# Patient Record
Sex: Female | Born: 1985
Health system: Southern US, Community
[De-identification: ages and names within clinical notes are randomized; demographics above are authoritative.]

## PROBLEM LIST (undated history)

## (undated) ENCOUNTER — Inpatient Hospital Stay (HOSPITAL_COMMUNITY): Payer: Self-pay

## (undated) DIAGNOSIS — R87629 Unspecified abnormal cytological findings in specimens from vagina: Secondary | ICD-10-CM

## (undated) DIAGNOSIS — I10 Essential (primary) hypertension: Secondary | ICD-10-CM

## (undated) DIAGNOSIS — Z3490 Encounter for supervision of normal pregnancy, unspecified, unspecified trimester: Secondary | ICD-10-CM

## (undated) DIAGNOSIS — R51 Headache: Secondary | ICD-10-CM

## (undated) DIAGNOSIS — E119 Type 2 diabetes mellitus without complications: Secondary | ICD-10-CM

## (undated) DIAGNOSIS — R569 Unspecified convulsions: Secondary | ICD-10-CM

## (undated) HISTORY — DX: Headache: R51

## (undated) HISTORY — DX: Encounter for supervision of normal pregnancy, unspecified, unspecified trimester: Z34.90

## (undated) HISTORY — PX: LEEP: SHX91

## (undated) HISTORY — DX: Essential (primary) hypertension: I10

## (undated) HISTORY — PX: NO PAST SURGERIES: SHX2092

## (undated) HISTORY — DX: Type 2 diabetes mellitus without complications: E11.9

## (undated) HISTORY — DX: Unspecified abnormal cytological findings in specimens from vagina: R87.629

## (undated) HISTORY — DX: Unspecified convulsions: R56.9

---
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
## 2006-03-22 ENCOUNTER — Emergency Department (HOSPITAL_COMMUNITY): Admission: EM | Admit: 2006-03-22 | Discharge: 2006-03-22 | Payer: Self-pay | Admitting: Emergency Medicine

## 2006-04-19 ENCOUNTER — Emergency Department (HOSPITAL_COMMUNITY): Admission: EM | Admit: 2006-04-19 | Discharge: 2006-04-19 | Payer: Self-pay | Admitting: Family Medicine

## 2006-10-14 ENCOUNTER — Emergency Department (HOSPITAL_COMMUNITY): Admission: EM | Admit: 2006-10-14 | Discharge: 2006-10-14 | Payer: Self-pay | Admitting: Emergency Medicine

## 2006-10-19 ENCOUNTER — Emergency Department (HOSPITAL_COMMUNITY): Admission: EM | Admit: 2006-10-19 | Discharge: 2006-10-19 | Payer: Self-pay | Admitting: Family Medicine

## 2007-01-17 ENCOUNTER — Ambulatory Visit (HOSPITAL_COMMUNITY): Admission: RE | Admit: 2007-01-17 | Discharge: 2007-01-17 | Payer: Self-pay | Admitting: Family Medicine

## 2007-01-17 ENCOUNTER — Emergency Department (HOSPITAL_COMMUNITY): Admission: EM | Admit: 2007-01-17 | Discharge: 2007-01-17 | Payer: Self-pay | Admitting: Emergency Medicine

## 2007-01-17 ENCOUNTER — Emergency Department (HOSPITAL_COMMUNITY): Admission: EM | Admit: 2007-01-17 | Discharge: 2007-01-17 | Payer: Self-pay | Admitting: Family Medicine

## 2009-08-27 ENCOUNTER — Emergency Department (HOSPITAL_COMMUNITY): Admission: EM | Admit: 2009-08-27 | Discharge: 2009-08-27 | Payer: Self-pay | Admitting: Emergency Medicine

## 2010-02-28 ENCOUNTER — Ambulatory Visit: Payer: Self-pay | Admitting: Family Medicine

## 2010-02-28 DIAGNOSIS — R569 Unspecified convulsions: Secondary | ICD-10-CM | POA: Insufficient documentation

## 2010-02-28 DIAGNOSIS — I10 Essential (primary) hypertension: Secondary | ICD-10-CM

## 2010-02-28 DIAGNOSIS — R51 Headache: Secondary | ICD-10-CM

## 2010-02-28 DIAGNOSIS — R519 Headache, unspecified: Secondary | ICD-10-CM | POA: Insufficient documentation

## 2010-02-28 HISTORY — DX: Essential (primary) hypertension: I10

## 2010-02-28 HISTORY — DX: Headache: R51
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
## 2010-05-30 ENCOUNTER — Ambulatory Visit: Payer: Self-pay | Admitting: Family Medicine

## 2010-06-03 ENCOUNTER — Telehealth: Payer: Self-pay | Admitting: Family Medicine

## 2010-06-12 ENCOUNTER — Telehealth: Payer: Self-pay | Admitting: Internal Medicine

## 2010-06-12 ENCOUNTER — Emergency Department (HOSPITAL_COMMUNITY): Admission: EM | Admit: 2010-06-12 | Discharge: 2010-06-12 | Payer: Self-pay | Admitting: Emergency Medicine

## 2010-06-13 ENCOUNTER — Ambulatory Visit: Payer: Self-pay | Admitting: Internal Medicine

## 2010-06-13 DIAGNOSIS — M7989 Other specified soft tissue disorders: Secondary | ICD-10-CM

## 2010-06-13 DIAGNOSIS — T50995A Adverse effect of other drugs, medicaments and biological substances, initial encounter: Secondary | ICD-10-CM | POA: Insufficient documentation

## 2010-06-27 ENCOUNTER — Ambulatory Visit: Payer: Self-pay | Admitting: Family Medicine

## 2010-06-27 LAB — CONVERTED CEMR LAB
Calcium: 9.1 mg/dL (ref 8.4–10.5)
Chloride: 106 meq/L (ref 96–112)
Creatinine, Ser: 0.8 mg/dL (ref 0.4–1.2)
GFR calc non Af Amer: 119.44 mL/min (ref >60)
Sodium: 136 meq/L (ref 135–145)

## 2010-12-30 NOTE — Assessment & Plan Note (Signed)
Summary: concerned about BP/dm   Vital Signs:  Patient profile:   25 year old female Menstrual status:  regular Temp:     98.0 degrees F oral BP sitting:   120 / 88  (left arm) Cuff size:   regular  Vitals Entered By: Sid Falcon LPN (June 27, 2010 11:19 AM) CC: BP checl   History of Present Illness: Followup hypertension. Patient had some headaches and edema issues on amlodipine. She felt fatigued on Hydrocort thiazide. Those medicaions were discontinued and patient started on Prinivil 10 mg daily last visit. Her blood pressures have been higher with diastolics in the 90s and most systolics 120-130 range.  Patient is exercising about 2-3 days per week. Cautious with sodium intake. Denies any dizziness, cough, or other side effect.  Allergies: 1)  Bactrim (Sulfamethoxazole-Trimethoprim)  Past History:  Past Medical History: Last updated: 06/13/2010 Headache/Migraines Hypertension  ? onset 2011 Seizure disorder/Epilepsy  age 31 years  grand mal no seizure since 2004 PMH reviewed for relevance  Review of Systems  The patient denies chest pain, syncope, dyspnea on exertion, peripheral edema, prolonged cough, and headaches.    Physical Exam  General:  Well-developed,well-nourished,in no acute distress; alert,appropriate and cooperative throughout examination Eyes:  No corneal or conjunctival inflammation noted. EOMI. Perrla. Funduscopic exam benign, without hemorrhages, exudates or papilledema. Vision grossly normal. Neck:  No deformities, masses, or tenderness noted. Lungs:  Normal respiratory effort, chest expands symmetrically. Lungs are clear to auscultation, no crackles or wheezes. Heart:  Normal rate and regular rhythm. S1 and S2 normal without gallop, murmur, click, rub or other extra sounds. Extremities:  no edema   Impression & Recommendations:  Problem # 1:  HYPERTENSION (ICD-401.9) Assessment Improved  side effects have resolved off amlodipine. Increase  lisinopril to 20 mg daily The following medications were removed from the medication list:    Amlodipine Besylate 5 Mg Tabs (Amlodipine besylate) ..... Once daily    Hydrochlorothiazide 25 Mg Tabs (Hydrochlorothiazide) ..... One half tablet a day as needed Her updated medication list for this problem includes:    Prinivil 20 Mg Tabs (Lisinopril) ..... One by mouth once daily  Orders: TLB-BMP (Basic Metabolic Panel-BMET) (80048-METABOL) Specimen Handling ([address]) Venipuncture (60[address])  Complete Medication List: 1)  Topamax 100 Mg Tabs (Topiramate) .... Once daily 2)  Prinivil 20 Mg Tabs (Lisinopril) .... One by mouth once daily  Patient Instructions: 1)  Please schedule a follow-up appointment in 6 months .  2)  Limit your Sodium(salt) .  3)  It is important that you exercise reguarly at least 20 minutes 5 times a week. If you develop chest pain, have severe difficulty breathing, or feel very tired, stop exercising immediately and seek medical attention.  4)  You need to lose weight. Consider a lower calorie diet and regular exercise.  5)  Check your  Blood Pressure regularly . If it is above:  140/90 you should make an appointment. Prescriptions: PRINIVIL 20 MG TABS (LISINOPRIL) one by mouth once daily  #90 x 3   Entered and Authorized by:   Evelena Peat MD   Signed by:   Evelena Peat MD on 06/27/2010   Method used:   [facility] to        [facility]  [phone]* (retail)       [address] [place], [place]  [address]       Ph: [phone] or [phone]       Fax: [phone]  RxID:   [id number]

## 2010-12-30 NOTE — Assessment & Plan Note (Signed)
Summary: BRAND NEW PT/TO EST/CJR   Vital Signs:  Patient profile:   25 year old female Menstrual status:  regular LMP:     02/20/2010 Height:      64.75 inches Weight:      175 pounds BMI:     29.45 Temp:     98.7 degrees F oral Pulse rate:   100 / minute Pulse rhythm:   regular Resp:     12 per minute BP sitting:   130 / 90  (left arm) Cuff size:   regular  Vitals Entered By: Sid Falcon LPN (February 28, 1609 11:29 AM)  Nutrition Counseling: Patient's BMI is greater than 25 and therefore counseled on weight management options.  Serial Vital Signs/Assessments:  Time      Position  BP       Pulse  Resp  Temp     By                     118/68                         Evelena Peat MD  CC: New to establish, BP concerns, Hypertension Management, Headache Is Patient Diabetic? No LMP (date): 02/20/2010     Menstrual Status regular Enter LMP: 02/20/2010 Last PAP Result normal   History of Present Illness: Patient is new to establish care.  Recently diagnosed hypertension. Went to urgent care with several nonspecific symptoms including diaphoresis and headache. Blood pressure 150 systolic. Started amlodipine 5 mg daily and tolerating well. Blood pressure 130s systolic when checked at drug store. No headaches. Minimal edema.  she reports that she had several labs including thyroid functions at urgent care recently all normal.  Other chronic problems include history of migraine headaches but these are stable. History of grand mal seizures since age 29 but none in 6 years now. Sees neurologist and treated with Topamax 100 mg daily.  Family history significant for multiple individuals with hypertension and type 2 diabetes in both parents. Mother had stroke. Sister with hypertension.  Patient working on masters degree in counseling. Nonsmoker. No alcohol use.  Hypertension History:      She denies headache, chest pain, palpitations, dyspnea with exertion, neurologic problems,  syncope, and side effects from treatment.        Positive major cardiovascular risk factors include hypertension.  Negative major cardiovascular risk factors include female age less than 43 years old and non-tobacco-user status.      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 Alcohol use-no Regular exercise-no  Risk Factors: Exercise: no (02/28/2010)  Risk Factors: Smoking Status: never (02/28/2010)  Past Medical History: Headache/Migraines Hypertension Seizure disorder/Epilepsy  Past Surgical History: no surgical history  Family History: Family History of Alcoholism/Addiction, mental illness, brother Mother, stroke, hypertension, emotional illness, diabetes Father, hypertension, emotional illness, diabetes Maternal grandfather, sudden death < 63 Maternal grandfather, diabetes  Social History: 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  The patient denies anorexia, fever, weight loss, hoarseness, chest pain, syncope, dyspnea on exertion, prolonged cough, headaches, hemoptysis, abdominal pain, melena, hematochezia, severe indigestion/heartburn, hematuria, muscle weakness, and depression.    Physical Exam  General:   Well-developed,well-nourished,in no acute distress; alert,appropriate and cooperative throughout examination Head:  Normocephalic and atraumatic without obvious abnormalities. No apparent alopecia or balding. Eyes:  pupils equal, pupils round, and pupils reactive to light.   Ears:  External ear exam shows no significant lesions or deformities.  Otoscopic examination reveals clear canals, tympanic membranes are intact bilaterally without bulging, retraction, inflammation or discharge. Hearing is grossly normal bilaterally. Mouth:  Oral mucosa and oropharynx without lesions or exudates.  Teeth in good repair. Neck:  No deformities, masses, or tenderness noted. Lungs:  Normal respiratory effort, chest expands symmetrically. Lungs are clear to auscultation, no crackles or wheezes. Heart:  Normal rate and regular rhythm. S1 and S2 normal without gallop, murmur, click, rub or other extra sounds. Extremities:  no edema. Neurologic:  alert & oriented X3, cranial nerves II-XII intact, strength normal in all extremities, and gait normal.   Cervical Nodes:  No lymphadenopathy noted Psych:  normally interactive, good eye contact, not anxious appearing, and not depressed appearing.     Impression & Recommendations:  Problem # 1:  HYPERTENSION (ICD-401.9) Assessment Improved  Her updated medication list for this problem includes:    Amlodipine Besylate 5 Mg Tabs (Amlodipine besylate) ..... Once daily  Problem # 2:  SEIZURE DISORDER (ICD-780.39) Assessment: Unchanged  Her updated medication list for this problem includes:    Topamax 100 Mg Tabs (Topiramate) ..... Once daily  Problem # 3:  HEADACHE (ICD-784.0) Hx of migraines but these are infrequent on Topamax.  Complete Medication List: 1)  Topamax 100 Mg Tabs (Topiramate) .... Once daily 2)  Amlodipine Besylate 5 Mg Tabs (Amlodipine besylate) .... Once daily  Hypertension Assessment/Plan:      The patient's hypertensive risk group is category  A: No risk factors and no target organ damage.  Today's blood pressure is 130/90.    Patient Instructions: 1)  Please schedule a follow-up appointment in 3 months .  2)  Limit your Sodium(salt) .  3)  It is important that you exercise reguarly at least 20 minutes 5 times a week. If you develop chest pain, have severe difficulty breathing, or feel very tired, stop exercising immediately and seek medical attention.  4)  You need to lose weight. Consider a lower calorie diet and regular exercise.  Prescriptions: AMLODIPINE BESYLATE 5 MG TABS (AMLODIPINE BESYLATE) once daily  #30 x 5   Entered and Authorized by:   Evelena Peat MD   Signed by:   Evelena Peat MD on 02/28/2010   Method used:   [facility]        [facility]  [address]  [phone]* (retail)       [address] [place], [place]  [address]       Ph: [phone] or [phone]       Fax: [phone]   RxID:   [id number]   Preventive Care Screening  Pap Smear:    Date:  12/31/2009    Results:  normal   Last Tetanus Booster:    Date:  11/30/1997    Results:  Historical      Mammogram 2007, normal

## 2010-12-30 NOTE — Assessment & Plan Note (Signed)
Summary: 3 month rov/njhr   Vital Signs:  Patient profile:   25 year old female Menstrual status:  regular Weight:      180 pounds Temp:     98 degrees F oral BP sitting:   132 / 92  (left arm) Cuff size:   regular  Vitals Entered By: Sid Falcon LPN (May 31, 7123 10:16 AM) CC: 3 mo nth follow-up, Hypertension Management   History of Present Illness: here for BP follow up. BPs at home mostly 130s systolic with occ of 140s and diastolics usually 80s to low 90s. On amlodipine and compliant with therapy.  Hypertension History:      She complains of peripheral edema, but denies chest pain, palpitations, dyspnea with exertion, orthopnea, PND, visual symptoms, neurologic problems, syncope, and side effects from treatment.        Positive major cardiovascular risk factors include hypertension.  Negative major cardiovascular risk factors include female age less than 34 years old and non-tobacco-user status.     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 Alcohol use-no Regular exercise-no  Past Medical History: Headache/Migraines Hypertension  Seizure disorder/Epilepsy  Family History: Family History of Alcoholism/Addiction, mental illness, brother Mother, stroke, hypertension, emotional illness, diabetes Father, hypertension, emotional illness, diabetes Maternal grandfather, sudden death < 42 Maternal grandfather, diabetes Sister hypertension  Review of Systems      See HPI  Physical Exam  General:  Well-developed,well-nourished,in no acute distress; alert,appropriate and cooperative throughout examination Neck:  No deformities, masses, or tenderness noted. Lungs:  Normal respiratory effort, chest expands symmetrically. Lungs are clear to auscultation, no crackles or wheezes. Heart:  Normal rate and regular rhythm. S1 and S2 normal without gallop,  murmur, click, rub or other extra sounds. Extremities:  trace nonpitting edema bilaterally.   Impression & Recommendations:  Problem # 1:  HYPERTENSION (ICD-401.9) for the most part controlled.  Add low dose HCTZ as needed for edema and cont weight loss efforts. Her updated medication list for this problem includes:    Amlodipine Besylate 5 Mg Tabs (Amlodipine besylate) ..... Once daily    Hydrochlorothiazide 25 Mg Tabs (Hydrochlorothiazide) ..... One half tablet a day as needed  Complete Medication List: 1)  Topamax 100 Mg Tabs (Topiramate) .... Once daily 2)  Amlodipine Besylate 5 Mg Tabs (Amlodipine besylate) .... Once daily 3)  Hydrochlorothiazide 25 Mg Tabs (Hydrochlorothiazide) .... One half tablet a day as needed  Hypertension Assessment/Plan:      The patient's hypertensive risk group is category A: No risk factors and no target organ damage.  Today's blood pressure is 132/92.    Patient Instructions: 1)  Check your  Blood Pressure regularly . If it is above: 130/80  you should make an appointment. 2)  Please schedule a follow-up appointment in 6 months .  3)  It is important that you exercise reguarly at least 20 minutes 5 times a week. If you develop chest pain, have severe difficulty breathing, or feel very tired, stop exercising immediately and seek medical attention.  Prescriptions: HYDROCHLOROTHIAZIDE 25 MG TABS (HYDROCHLOROTHIAZIDE) one half tablet a day as needed  #30 x 3   Entered and Authorized by:   Evelena Peat MD   Signed by:   Evelena Peat MD on 05/30/2010   Method used:   [facility]ron[facility] to        [facility]  [address]  [phone]* (retail)       [address] [place], [place]  [address]       Ph: [phone] or [phone]       Fax: [phone]   RxID:   [id number]

## 2010-12-30 NOTE — Progress Notes (Signed)
Summary: BP concerns  Phone Note Call from Patient   Caller: Patient Call For: Evelena Peat MD Summary of Call: Pt called earlier and requested appt with Dr. Caryl Never when he returns for elevated BP in her GYN office today.  Scheduled Monday and to call back if needs to see one of our other MDs. BP was 150/90, she thinks.  Sister called back and is stating she went to the ER and needs to be seen earlier.  Sheduled appt tomorrow with Dr. Fabian Sharp. [phone] Initial call taken by: Lynann Beaver CMA,  June 12, 2010 5:05 PM    need copy of  records  of ed evlauations before this work in visit.  Madelin Headings MD  June 12, 2010 6:15 PM

## 2010-12-30 NOTE — Progress Notes (Signed)
Summary: leg cramps  Phone Note Call from Patient   Caller: Patient Call For: Evelena Peat MD Summary of Call: Pt has been having leg cramps since beginning the HCTZ, and is asking advice. [phone]  [facility] Initial call taken by: Lynann Beaver CMA,  June 03, 2010 10:24 AM  Follow-up for Phone Call        Make sure she is consuming plenty of K rich foods.  If symptoms persist into next week would go ahead with assessing BMP at that time. Follow-up by: Evelena Peat MD,  June 03, 2010 1:09 PM  Additional Follow-up for Phone Call Additional follow up Details #1::        Pt given Dr. Lucie Leather recommendations. Additional Follow-up by: Lynann Beaver CMA,  June 03, 2010 1:50 PM

## 2010-12-30 NOTE — Assessment & Plan Note (Signed)
Summary: seen in ER with elevated BP and ER spoke to Dr. Todd/dm   Vital Signs:  Patient profile:   25 year old female Menstrual status:  regular LMP:     05/19/2010 Weight:      179 pounds Pulse rate:   60 / minute BP sitting:   120 / 80  (left arm) Cuff size:   regular  Vitals Entered By: Romualdo Bolk, CMA (AAMA) (June 13, 2010 1:54 PM)  Serial Vital Signs/Assessments:  Time      Position  BP       Pulse  Resp  Temp     By                     981/19                         Madelin Headings MD                     124/88                         Madelin Headings MD  Comments: large cuff sitting right By: Madelin Headings MD  reg cuff sitting By: Madelin Headings MD   CC: Pt states that her bp running 140/98 x 1 to 1 1/2 weeks with swelling in feet and legs. Pt was seen in the ED on 7/14. , Hypertension Management, Swelling has been 2-3 times the normal size. Pt states that the HCTZ makes her dizzy even when eating a banana and purnes.  LMP (date): 05/19/2010     Enter LMP: 05/19/2010 Last PAP Result normal   History of Present Illness: Anna Rhodes comes in today  for SDA appt  because of concerns from patient.  She was seen in ed   yesterday because of "swelling ' in LE  and gyne said   Bp was "extremely high "140/90"  . She had  had nl renal function and ua   and  descrived trace edeam and Dr Tawanna Cooler  rec she be seen next week by Dr Caryl Never. S he had been on norvasc didnt take hctx cause of   ?  dizziness. However she called yesterday and wanted to be seen today. because doesnt want to take anything that makes her legs swell.  Also worried about med interactions. Sees  neuro about every  3 months.  [facility] and no siezure in 7 years.  Apparently first dx of Ht was when had syncope and went to ed and was found to have Bp in the 190 range?  no records for this to review today.    Hypertension History:      She complains of headache, dyspnea with exertion, peripheral  edema, and side effects from treatment, but denies chest pain, palpitations, orthopnea, PND, visual symptoms, neurologic problems, and syncope.  She notes the following problems with antihypertensive medication side effects: Swelling due to Norvasc.        Positive major cardiovascular risk factors include hypertension.  Negative major cardiovascular risk factors include female age less than 31 years old and non-tobacco-user status.     Preventive Screening-Counseling & Management  Alcohol-Tobacco     Alcohol drinks/day: 0     Smoking Status: never  Caffeine-Diet-Exercise     Caffeine use/day: 0     Does Patient Exercise: no  Current Medications (verified): 1)  Topamax 100 Mg Tabs (Topiramate) .... Once Daily 2)  Amlodipine Besylate 5 Mg Tabs (Amlodipine Besylate) .... Once Daily 3)  Hydrochlorothiazide 25 Mg Tabs (Hydrochlorothiazide) .... One Half Tablet A Day As Needed  Allergies (verified): 1)  Bactrim (Sulfamethoxazole-Trimethoprim)  Past History:  Past medical, surgical, family and social histories (including risk factors) reviewed, and no changes noted (except as noted below).  Past Medical History: Headache/Migraines Hypertension  ? onset 2011 Seizure disorder/Epilepsy  age 77 years  grand mal no seizure since 2004  Past Surgical History: Reviewed history from 02/28/2010 and no changes required. no surgical history  Past History:  Care Management: Neurology: [facility]   Family History: Reviewed history from 05/30/2010 and no changes required. Family History of Alcoholism/Addiction, mental illness, brother Mother, stroke, hypertension, emotional illness, diabetes Father, hypertension, emotional illness, diabetes Maternal grandfather, sudden death < 66 Maternal grandfather, diabetes Sister hypertension  Social History: Reviewed history from 02/28/2010 and no changes required. 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  no pets   Caffeine use/day:  0  Review of Systems  The patient denies anorexia, fever, weight loss, weight gain, vision loss, decreased hearing, chest pain, syncope, dyspnea on exertion, prolonged cough, abdominal pain, transient blindness, difficulty walking, abnormal bleeding, enlarged lymph nodes, and angioedema.    Physical Exam  General:  alert, well-developed, well-nourished, and well-hydrated.   Head:  normocephalic and atraumatic.   Eyes:  vision grossly intact, pupils equal, and pupils round.   Neck:  No deformities, masses, or tenderness noted. Lungs:  Normal respiratory effort, chest expands symmetrically. Lungs are clear to auscultation, no crackles or wheezes.no dullness.   Heart:  Normal rate and regular rhythm. S1 and S2 normal without gallop, murmur, click, rub or other extra sounds.no lifts.   see bp readings Abdomen:  Bowel sounds positive,abdomen soft and non-tender without masses, organomegaly or   noted. pierced umbi Msk:  no acute changes  Pulses:  pulses intact without delay   Extremities:  trace left pedal edema and trace right pedal edema. no redness no cc    Neurologic:  alert & oriented X3 and gait normal.   slightly off affect  ? understanding of worries  Skin:  turgor normal, color normal, no ecchymoses, and no petechiae.   Cervical Nodes:  No lymphadenopathy noted Psych:  Oriented X3, not anxious appearing, and not depressed appearing.   calm slightly flat affect   obtained and reviewed   ED records    Impression & Recommendations:  Problem # 1:  HYPERTENSION (ICD-401.9) ok to day except borderline diastolic     except by hx   no extreme emergent BP readings. options dicussed and this is a management issue  and should follow up with Dr Caryl Never as PCP.   will start an ace  although may not be as potent  and  have her .fu  in a week   is not planning pregnancy and  told  ACEI should not be taken in preg  Her updated medication list for this problem includes:    Amlodipine Besylate 5 Mg Tabs (Amlodipine besylate) ..... Once daily    Hydrochlorothiazide 25 Mg Tabs (Hydrochlorothiazide) ..... One half tablet a day as needed    Prinivil 20 Mg Tabs (Lisinopril) .Marland KitchenMarland KitchenMarland KitchenMarland Kitchen  1/2 by mouth once daily  and can increase to 1 by mouth once daily  if blood pressure not controlled  Problem # 2:  SWELLING OF LIMB (ICD-729.81) looks minimal but problematic and  discomfort in her legs and she thinks probably correctly from the amlodipine.  doesnt wasnt to take this med or anything that could cause swelling.  Problem # 3:  ADVERSE REACTION TO MEDICATION (CZY-606.30) minor  seemingly  but unacceptable to her   Problem # 4:  SEIZURE DISORDER (ICD-780.39) stable  Her updated medication list for this problem includes:    Topamax 100 Mg Tabs (Topiramate) ..... Once daily  Complete Medication List: 1)  Topamax 100 Mg Tabs (Topiramate) .... Once daily 2)  Amlodipine Besylate 5 Mg Tabs (Amlodipine besylate) .... Once daily 3)  Hydrochlorothiazide 25 Mg Tabs (Hydrochlorothiazide) .... One half tablet a day as needed 4)  Prinivil 20 Mg Tabs (Lisinopril) .... 1/2 by mouth once daily  and can increase to 1 by mouth once daily  if blood pressure not controlled  Hypertension Assessment/Plan:      The patient's hypertensive risk group is category A: No risk factors and no target organ damage.  Today's blood pressure is 120/80.    Patient Instructions: 1)  can stop the norvasc    because of the swelling. 2)  after a few day begin the new BP medication ace inhibitor . 3)  1 by mouth once daily . 4)  Rov with  Dr Caryl Never in a week ( next friday)  5)  Bring in your blood pressure machine in to the appt so this can be compared to the Office reading.  Prescriptions: PRINIVIL 20 MG TABS (LISINOPRIL) 1/2 by mouth once daily  and can  increase to 1 by mouth once daily  if blood pressure not controlled  #30 x 1   Entered and Authorized by:   Madelin Headings MD   Signed by:   Madelin Headings MD on 06/13/2010   Method used:   Electronically to        [facility]  [phone]* (retail)       [address] [place], [place]  [address]       Ph: [phone] or [phone]       Fax: [phone]   RxID:   [id number]

## 2011-01-27 ENCOUNTER — Ambulatory Visit (INDEPENDENT_AMBULATORY_CARE_PROVIDER_SITE_OTHER): Payer: 59 | Admitting: Family Medicine

## 2011-01-27 ENCOUNTER — Encounter: Payer: Self-pay | Admitting: Family Medicine

## 2011-01-27 VITALS — BP 120/80 | Temp 98.5°F | Ht 65.0 in | Wt 189.0 lb

## 2011-01-27 DIAGNOSIS — I10 Essential (primary) hypertension: Secondary | ICD-10-CM

## 2011-01-27 DIAGNOSIS — R569 Unspecified convulsions: Secondary | ICD-10-CM

## 2011-01-27 MED ORDER — LISINOPRIL 20 MG PO TABS: 20.0000 mg | ORAL_TABLET | Freq: Every day | ORAL | Status: DC

## 2011-01-27 NOTE — Progress Notes (Signed)
  Subjective:    Patient ID: Anna Rhodes, female    DOB: 09/05/1986, 25 y.o.   MRN: [id number]  HPI  Patient seen in followup hypertension. Prinivil 20 mg daily. Denies any cough or other side effects. No orthostasis. Blood pressure well controlled by home readings with 120 systolic and around 70-80 diastolic. Exercising fairly consistently. Has lost a little weight since last visit.  Past history menstrual cramps and heavy clots. Workup her gynecologist with ultrasound pending. Mother has history of fibroids.   Remote history of seizure disorder. No seizures 8 years. 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 No other cardiovascular or neurologic concerns.   Review of Systems  Constitutional: Negative for fatigue.  Eyes: Negative for visual disturbance.  Respiratory: Negative for cough, chest tightness, shortness of breath and wheezing.   Cardiovascular: Negative for chest pain, palpitations and leg swelling.  Neurological: Negative for dizziness, seizures, syncope, weakness, light-headedness and headaches.       Objective:   Physical Exam     Patient is alert and in no distress. Pupils equal reactive to light Neck supple no mass Chest clear to auscultation Heart regular rhythm and rate no murmur Extremities no edema Neurologic exam cranial nerves II through XII are normal. Strength of focal deficits. Gait normal.    Assessment & Plan:   #1 hypertension adequate control refill medicine for one year. Work on weight loss aerobic exercise #2 seizure disorder stable for many years. Department of motor vehicle forms completed

## 2011-01-27 NOTE — Patient Instructions (Signed)
Hypertension (High Blood Pressure)   As your heart beats, it forces blood through your arteries. This force is your blood pressure. If the pressure is too high, it is called hypertension (HTN) or high blood pressure. HTN is dangerous because you may have it and not know it. High blood pressure may mean that your heart has to work harder to pump blood. Your arteries may be narrow or stiff. The extra work puts you at risk for heart disease, stroke, and other problems.   Blood pressure consists of two numbers, a higher number over a lower, 110/72, for example. It is stated as “110 over 72.” The ideal is below 120 for the top number (systolic) and under 80 for the bottom (diastolic).   You should pay close attention to your blood pressure if you have certain conditions such as:   Ø Heart failure.   Ø Prior heart attack.   Ø Diabetes Ø Chronic kidney disease.   Ø Prior stroke.   Ø Multiple risk factors for heart disease.       To see if you have HTN, your blood pressure should be measured while you are seated with your arm held at the level of the heart. It should be measured at least twice. A one-time elevated blood pressure reading (especially in the Emergency Department) does not mean that you need treatment. There may be conditions in which the blood pressure is different between your right and left arms. It is important to see your caregiver soon for a recheck.   Most people have essential hypertension which means that there is not a specific cause. This type of high blood pressure may be lowered by changing lifestyle factors such as:   Ø Stress.   Ø Smoking.   Ø Lack of exercise. Ø Excessive weight.   Ø Drug/tobacco/alcohol use.   Ø Eating less salt.    Most people do not have symptoms from high blood pressure until it has caused damage to the body. Effective treatment can often prevent, delay or reduce that damage.   TREATMENT:   Treatment for high blood pressure, when a cause has been identified, is directed at  the cause. There are a large number of medications to treat HTN. These fall into several categories, and your caregiver will help you select the medicines that are best for you. Medications may have side effects. You should review side effects with your caregiver.   If your blood pressure stays high after you have made lifestyle changes or started on medicines,   Ø Your medication(s) may need to be changed.   Ø Other problems may need to be addressed.   Ø Be certain you understand your prescriptions, and know how and when to take your medicine.   Ø Be sure to follow up with your caregiver within the time frame advised (usually within two weeks) to have your blood pressure rechecked and to review your medications.   Ø If you are taking more than one medicine to lower your blood pressure, make sure you know how and at what times they should be taken. Taking two medicines at the same time can result in blood pressure that is too low.   SEEK IMMEDIATE MEDICAL CARE IF YOU DEVELOP:   Ø A severe headache, blurred or changing vision, or confusion.   Ø Unusual weakness or numbness, or a faint feeling.   Ø Severe chest or abdominal pain, vomiting, or breathing problems.   It is your responsibility to follow   up with your caregiver in order to determine if your elevated blood pressure should be treated.   MAKE SURE YOU:   Ø Understand these instructions.   Ø Will watch your condition.   Ø Will get help right away if you are not doing well or get worse.   Document Released: 04/29/2006 Document Re-Released: 02/12/2009   Exit[employer]® Patient Information ©2011 [employer].

## 2011-02-15 LAB — POCT I-STAT, CHEM 8
BUN: 10 mg/dL (ref 6–23)
Calcium, Ion: 1.21 mmol/L (ref 1.12–1.32)
Chloride: 109 meq/L (ref 96–112)
Creatinine, Ser: 0.9 mg/dL (ref 0.4–1.2)
Glucose, Bld: 119 mg/dL — ABNORMAL HIGH (ref 70–99)
HCT: 44 % (ref 36.0–46.0)
Hemoglobin: 15 g/dL (ref 12.0–15.0)
Potassium: 3.6 meq/L (ref 3.5–5.1)
Sodium: 141 mEq/L (ref 135–145)
TCO2: 22 mmol/L (ref 0–100)

## 2011-02-15 LAB — URINE MICROSCOPIC-ADD ON

## 2011-02-15 LAB — URINALYSIS, ROUTINE W REFLEX MICROSCOPIC
Glucose, UA: NEGATIVE mg/dL
Ketones, ur: NEGATIVE mg/dL
Specific Gravity, Urine: 1.022 (ref 1.005–1.030)
Urobilinogen, UA: 0.2 mg/dL (ref 0.0–1.0)
pH: 7 (ref 5.0–8.0)

## 2011-02-15 LAB — PREGNANCY, URINE: Preg Test, Ur: NEGATIVE

## 2011-02-15 LAB — URINE CULTURE

## 2011-05-05 ENCOUNTER — Encounter: Payer: Self-pay | Admitting: Family Medicine

## 2011-05-05 ENCOUNTER — Ambulatory Visit (INDEPENDENT_AMBULATORY_CARE_PROVIDER_SITE_OTHER): Payer: 59 | Admitting: Family Medicine

## 2011-05-05 VITALS — BP 110/78 | Temp 98.6°F | Wt 193.0 lb

## 2011-05-05 DIAGNOSIS — M549 Dorsalgia, unspecified: Secondary | ICD-10-CM

## 2011-05-05 DIAGNOSIS — I10 Essential (primary) hypertension: Secondary | ICD-10-CM

## 2011-05-05 DIAGNOSIS — R5383 Other fatigue: Secondary | ICD-10-CM

## 2011-05-05 DIAGNOSIS — G8929 Other chronic pain: Secondary | ICD-10-CM

## 2011-05-05 LAB — CBC WITH DIFFERENTIAL/PLATELET
Eosinophils Absolute: 0 10*3/uL (ref 0.0–0.7)
HCT: 40.2 % (ref 36.0–46.0)
Lymphocytes Relative: 23.9 % (ref 12.0–46.0)
MCHC: 34.3 g/dL (ref 30.0–36.0)
Monocytes Relative: 6.4 % (ref 3.0–12.0)
RBC: 4.47 Mil/uL (ref 3.87–5.11)
RDW: 13 % (ref 11.5–14.6)

## 2011-05-05 LAB — TSH: TSH: 1.67 u[IU]/mL (ref 0.35–5.50)

## 2011-05-05 LAB — BASIC METABOLIC PANEL
CO2: 24 mEq/L (ref 19–32)
Chloride: 108 mEq/L (ref 96–112)
Glucose, Bld: 117 mg/dL — ABNORMAL HIGH (ref 70–99)
Sodium: 137 mEq/L (ref 135–145)

## 2011-05-05 MED ORDER — LISINOPRIL 20 MG PO TABS: 20.0000 mg | ORAL_TABLET | Freq: Every day | ORAL | Status: DC

## 2011-05-05 MED ORDER — CYCLOBENZAPRINE HCL 5 MG PO TABS: 5.0000 mg | ORAL_TABLET | Freq: Three times a day (TID) | ORAL | Status: AC | PRN

## 2011-05-05 NOTE — Progress Notes (Signed)
  Subjective:    Patient ID: Anna Rhodes, female    DOB: 11-29-1986, 25 y.o.   MRN: [id number]  HPI Pt in for follow up hypertension. Takes lisinopril 20 mg daily. Blood pressures generally been well-controlled.  She has complaints of progressive fatigue over several months. Poor quality of sleep. Sometimes 3-4 hours sleep at night. Frequent early morning awakening and difficulty getting back to sleep. She denies any caffeine use after about 3 PM. No alcohol use. No history of depression.  Other issues upper back pain. Diffuse and bilateral.  Has tried heat, massage, and Advil without relief. Diffuse soreness. No radiculopathy symptoms. No weakness.   Review of Systems  Constitutional: Positive for fatigue. Negative for fever, activity change, appetite change and unexpected weight change.  Respiratory: Negative for cough, shortness of breath and wheezing.   Cardiovascular: Negative for chest pain, palpitations and leg swelling.  Genitourinary: Negative for dysuria.  Neurological: Negative for headaches.  Hematological: Negative for adenopathy. Does not bruise/bleed easily.       Objective:   Physical Exam  Constitutional: She is oriented to person, place, and time. She appears well-developed and well-nourished.  HENT:  Head: Normocephalic.  Neck: Neck supple. No thyromegaly present.  Cardiovascular: Normal rate, regular rhythm and normal heart sounds.   Pulmonary/Chest: Effort normal and breath sounds normal. No respiratory distress. She has no wheezes. She has no rales.  Musculoskeletal: She exhibits no edema.  Lymphadenopathy:    She has no cervical adenopathy.  Neurological: She is alert and oriented to person, place, and time.  Psychiatric: She has a normal mood and affect. Her behavior is normal.          Assessment & Plan:  #1 hypertension adequate control refill medication for one year #2 upper back pain. Suspect muscular. Probably related to poor sleep.  Continueaerobic exercise. Trial of cyclobenzaprine 5 mg each bedtime #3 fatigue probably related to poor sleep quality. Sleep hygiene discussed. Cyclobenzaprine as above. Check screening labs a CBC, basic metabolic panel, and TSH.

## 2011-05-06 NOTE — Progress Notes (Signed)
Quick Note:  Pt informed ______ 

## 2011-06-05 ENCOUNTER — Ambulatory Visit (INDEPENDENT_AMBULATORY_CARE_PROVIDER_SITE_OTHER): Payer: 59 | Admitting: Family Medicine

## 2011-06-05 ENCOUNTER — Encounter: Payer: Self-pay | Admitting: Family Medicine

## 2011-06-05 VITALS — BP 120/90 | Temp 97.8°F | Wt 199.0 lb

## 2011-06-05 DIAGNOSIS — G8929 Other chronic pain: Secondary | ICD-10-CM

## 2011-06-05 DIAGNOSIS — I10 Essential (primary) hypertension: Secondary | ICD-10-CM

## 2011-06-05 DIAGNOSIS — M549 Dorsalgia, unspecified: Secondary | ICD-10-CM

## 2011-06-05 DIAGNOSIS — E669 Obesity, unspecified: Secondary | ICD-10-CM

## 2011-06-05 NOTE — Patient Instructions (Signed)
We will call you with appointments for physical therapy and nutrition.

## 2011-06-05 NOTE — Progress Notes (Signed)
  Subjective:    Patient ID: Anna Rhodes, female    DOB: 11-Jun-1986, 25 y.o.   MRN: [id number]  HPI Patient seen for followup. Refer to prior note. History hypertension which has been well controlled. Concerns for obesity. She is exercising 3-4 days per week. Recently somewhat limited by chronic diffuse upper back pain. No radiculopathy symptoms. Some minimal relief with Flexeril. Mostly focusing on aerobic exercise. No weight lifting. No upper extremity weakness. Increased stress in general mostly with work-and feels frequent upper back muscle tension. She has large breasts and has considered breast reduction and wonders if this correlates with her chronic upper back pain which she has had for many years. She's never tried physical therapy.  Has tried massage without much relief.  Patient requesting referral to nutrition therapy. Main concern is difficulty losing weight. Recent TSH normal. No history of diabetes. Does have hypertension.   Review of Systems  Constitutional: Positive for fatigue. Negative for fever, activity change and appetite change.  Respiratory: Negative for cough and shortness of breath.   Cardiovascular: Negative for chest pain, palpitations and leg swelling.  Musculoskeletal: Positive for back pain.  Neurological: Negative for weakness.  Hematological: Negative for adenopathy.       Objective:   Physical Exam  Constitutional: She appears well-developed and well-nourished.  Neck: Neck supple.  Cardiovascular: Normal rate, regular rhythm and normal heart sounds.   Pulmonary/Chest: Effort normal and breath sounds normal. No respiratory distress. She has no wheezes. She has no rales.  Musculoskeletal: She exhibits no edema.       Full range of motion neck. No spinal tenderness. Full range of motion both shoulders and throughout upper extremities. No focal weakness  Lymphadenopathy:    She has no cervical adenopathy.  Neurological:       Full strength upper extremities  and symmetric reflexes.          Assessment & Plan:  #1 chronic upper back pain. Suspect muscular. Trial of physical therapy. She is inquiring about possible referral to a surgeon for breast reduction but explained trial of physical therapy is indicated first. #2 obesity. Referral to nutritionist.  Co-morbidity of hypertension. #3 hypertension stable

## 2011-06-23 ENCOUNTER — Ambulatory Visit: Payer: 59 | Admitting: Physical Therapy

## 2011-06-23 ENCOUNTER — Ambulatory Visit: Payer: 59 | Attending: Family Medicine | Admitting: Physical Therapy

## 2011-06-23 DIAGNOSIS — M546 Pain in thoracic spine: Secondary | ICD-10-CM | POA: Insufficient documentation

## 2011-06-23 DIAGNOSIS — IMO0001 Reserved for inherently not codable concepts without codable children: Secondary | ICD-10-CM | POA: Insufficient documentation

## 2011-06-23 DIAGNOSIS — M25519 Pain in unspecified shoulder: Secondary | ICD-10-CM | POA: Insufficient documentation

## 2011-06-30 ENCOUNTER — Ambulatory Visit: Payer: 59 | Admitting: Physical Therapy

## 2011-07-02 ENCOUNTER — Encounter: Payer: 59 | Admitting: Physical Therapy

## 2011-07-07 ENCOUNTER — Encounter: Payer: 59 | Admitting: Physical Therapy

## 2011-07-09 ENCOUNTER — Ambulatory Visit: Payer: 59 | Attending: Family Medicine | Admitting: Physical Therapy

## 2011-07-09 DIAGNOSIS — IMO0001 Reserved for inherently not codable concepts without codable children: Secondary | ICD-10-CM | POA: Insufficient documentation

## 2011-07-09 DIAGNOSIS — M25519 Pain in unspecified shoulder: Secondary | ICD-10-CM | POA: Insufficient documentation

## 2011-07-09 DIAGNOSIS — M546 Pain in thoracic spine: Secondary | ICD-10-CM | POA: Insufficient documentation

## 2011-07-14 ENCOUNTER — Encounter: Payer: 59 | Admitting: Physical Therapy

## 2011-07-16 ENCOUNTER — Encounter: Payer: 59 | Admitting: Physical Therapy

## 2011-07-28 ENCOUNTER — Ambulatory Visit: Payer: 59 | Admitting: Family Medicine

## 2011-12-14 ENCOUNTER — Ambulatory Visit (INDEPENDENT_AMBULATORY_CARE_PROVIDER_SITE_OTHER): Payer: 59 | Admitting: Family Medicine

## 2011-12-14 ENCOUNTER — Encounter: Payer: Self-pay | Admitting: Family Medicine

## 2011-12-14 VITALS — BP 120/80 | HR 111 | Temp 98.8°F | Wt 199.0 lb

## 2011-12-14 DIAGNOSIS — M549 Dorsalgia, unspecified: Secondary | ICD-10-CM

## 2011-12-14 DIAGNOSIS — R300 Dysuria: Secondary | ICD-10-CM

## 2011-12-14 LAB — POCT URINALYSIS DIPSTICK
Ketones, UA: NEGATIVE
Nitrite, UA: NEGATIVE
Protein, UA: NEGATIVE
Spec Grav, UA: <=1.005
Urobilinogen, UA: 0.2
pH, UA: 7.5

## 2011-12-14 MED ORDER — NITROFURANTOIN MONOHYD MACRO 100 MG PO CAPS: 100.0000 mg | ORAL_CAPSULE | Freq: Two times a day (BID) | ORAL | Status: AC

## 2011-12-14 NOTE — Progress Notes (Signed)
  Subjective:    Patient ID: Anna Rhodes, female    DOB: 08-25-1986, 26 y.o.   MRN: [id number]  HPI  Acute visit. Patient presents with two day history right lower back pain. Pressure sensation off and on with urination but no burning with urination. Symptoms also occur at rest. No radiculopathy symptoms. No alleviating factors. Discomfort 6/10. Tried Advil without improvement. Last menstrual period last week relatively normal. Patient is not sexually active. Denies vaginal discharge. Slight nausea but no vomiting. Denies fever or chills. Denies any change in stool such as constipation or diarrhea.  Review of Systems  Constitutional: Negative for fever, chills and fatigue.  Respiratory: Negative for cough and shortness of breath.   Gastrointestinal: Positive for nausea. Negative for vomiting, diarrhea and constipation.  Genitourinary: Negative for frequency, hematuria, vaginal discharge, difficulty urinating and pelvic pain.  Musculoskeletal: Positive for back pain.       Objective:   Physical Exam  Constitutional: She appears well-developed and well-nourished. No distress.  Cardiovascular: Normal rate and regular rhythm.   Pulmonary/Chest: Effort normal and breath sounds normal. No respiratory distress. She has no wheezes. She has no rales.  Abdominal: Soft. Bowel sounds are normal. She exhibits no distension and no mass. There is no tenderness. There is no rebound and no guarding.  Musculoskeletal:       No CVA tenderness          Assessment & Plan:  Low back pain with dysuria in terms of pressure discomfort with urination. Abnormal urine dipstick with blood and leukocytes but she is finishing her menstrual period which may explain both. Urine culture sent. Cover Macrobid 100 mg twice a day for 5 days pending culture results. Plenty of fluids. Followup promptly for fever or worsening symptoms

## 2011-12-14 NOTE — Patient Instructions (Signed)
Drink lots of fluids Follow promptly for any increased fever or worsening abdominal pain.

## 2011-12-16 LAB — URINE CULTURE: Colony Count: >=100000

## 2012-04-14 ENCOUNTER — Ambulatory Visit (INDEPENDENT_AMBULATORY_CARE_PROVIDER_SITE_OTHER): Payer: 59 | Admitting: Family Medicine

## 2012-04-14 ENCOUNTER — Encounter: Payer: Self-pay | Admitting: Family Medicine

## 2012-04-14 VITALS — BP 130/90 | Temp 98.0°F | Wt 195.0 lb

## 2012-04-14 DIAGNOSIS — I10 Essential (primary) hypertension: Secondary | ICD-10-CM

## 2012-04-14 DIAGNOSIS — G4700 Insomnia, unspecified: Secondary | ICD-10-CM

## 2012-04-14 MED ORDER — LISINOPRIL-HYDROCHLOROTHIAZIDE 20-12.5 MG PO TABS: 1.0000 | ORAL_TABLET | Freq: Every day | ORAL | Status: DC

## 2012-04-14 NOTE — Patient Instructions (Signed)

## 2012-04-14 NOTE — Progress Notes (Signed)
  Subjective:    Patient ID: Anna Rhodes, female    DOB: Jan 25, 1986, 26 y.o.   MRN: [id number]  HPI  Patient seen with increased stress issues. Her father had a massive stroke several weeks ago. He is in a partial coma state. Patient's had incredible stress and difficulty with sleeping and also decreased appetite. Denies major depressed mood. Only getting about 4-5 hours sleep per night. She's been reluctant any over-the-counter medications because of seizure history. She feels anxious more than depressed.  Previously on sertraline but this was prior to her current seizure medication. She has not checked with her neurologist to see if they would approve of any anti anxiety medications.  Patient is considering setting up counseling  Hypertension treated with lisinopril 20 mg daily. She had recently some mild peripheral edema ankles mostly late day. Blood pressures by home readings around 140s over 90s. No headaches. No chest pains.  Past Medical History  Diagnosis Date  . HYPERTENSION 02/28/2010  . Headache 02/28/2010  . Seizures    No past surgical history on file.  reports that she has never smoked. She does not have any smokeless tobacco history on file. Her alcohol and drug histories not on file. family history includes Alcohol abuse in her brother; Diabetes in her father, maternal grandfather, and mother; Hypertension in her father, mother, and sister; Mental illness in her brother, father, and mother; Stroke in her mother; and Sudden death (age of onset:48) in her maternal grandfather. Allergies  Allergen Reactions  . Bactrim     hives  . Sulfamethoxazole W-Trimethoprim     REACTION: hives      Review of Systems  Constitutional: Positive for fatigue.  Respiratory: Negative for cough and shortness of breath.   Cardiovascular: Negative for chest pain.  Neurological: Negative for dizziness and syncope.  Psychiatric/Behavioral: Positive for sleep disturbance and dysphoric mood. Negative  for confusion and agitation. The patient is nervous/anxious.        Objective:   Physical Exam  Constitutional: She is oriented to person, place, and time. She appears well-developed and well-nourished.  Neck: Neck supple. No thyromegaly present.  Cardiovascular: Normal rate and regular rhythm.   Pulmonary/Chest: Effort normal and breath sounds normal. No respiratory distress. She has no wheezes. She has no rales.  Musculoskeletal: She exhibits no edema.  Neurological: She is alert and oriented to person, place, and time.  Psychiatric:       Somewhat depressed mood.          Assessment & Plan:  #1 hypertension. Marginal control. Change to lisinopril HCTZ 20/12.5 mg once daily. Reassess blood pressure 3 weeks. Discontinue if she has a rash or other side effects  #2 insomnia probably related to situational stressors. Handout given. She's also had significant anxiety and we reluctant to add medication such as SSRI without approval from neurologist and she will check with them.

## 2012-04-15 ENCOUNTER — Telehealth: Payer: Self-pay | Admitting: Family Medicine

## 2012-04-15 NOTE — Telephone Encounter (Signed)
Pt called and said that her neurologist, at [facility].  should be faxing over a note, that says all meds are fine except for Wellbutrin.

## 2012-04-15 NOTE — Telephone Encounter (Signed)
Start Celexa 20 mg one po daily and make sure pt has follow up within one month to reassess.

## 2012-04-15 NOTE — Telephone Encounter (Signed)
FYI only.

## 2012-04-18 MED ORDER — CITALOPRAM HYDROBROMIDE 20 MG PO TABS: 20.0000 mg | ORAL_TABLET | Freq: Every day | ORAL | Status: DC

## 2012-04-18 NOTE — Telephone Encounter (Signed)
Pt informed on home VM of new med sent to pharmacy, please return for follow-up in one month

## 2012-05-15 ENCOUNTER — Other Ambulatory Visit: Payer: Self-pay | Admitting: Family Medicine

## 2012-07-05 ENCOUNTER — Encounter: Payer: Self-pay | Admitting: Family Medicine

## 2012-07-05 ENCOUNTER — Ambulatory Visit (INDEPENDENT_AMBULATORY_CARE_PROVIDER_SITE_OTHER): Payer: 59 | Admitting: Family Medicine

## 2012-07-05 VITALS — BP 120/72 | Temp 97.9°F | Wt 204.0 lb

## 2012-07-05 DIAGNOSIS — L219 Seborrheic dermatitis, unspecified: Secondary | ICD-10-CM

## 2012-07-05 DIAGNOSIS — I10 Essential (primary) hypertension: Secondary | ICD-10-CM

## 2012-07-05 DIAGNOSIS — G4700 Insomnia, unspecified: Secondary | ICD-10-CM

## 2012-07-05 MED ORDER — MOMETASONE FUROATE 0.1 % EX SOLN: Freq: Every day | CUTANEOUS | Status: AC

## 2012-07-05 NOTE — Progress Notes (Signed)
  Subjective:    Patient ID: Anna Rhodes, female    DOB: [birth year], 26 y.o.   MRN: [id number]  HPI  Patient is here for medical followup. She is preparing to move to [place]. Father just had a massive stroke several months ago. Overall she is coping well. He is minimally communicative. She is moving to be closer to family.  Recently change blood pressure medicine to lisinopril HCTZ. She's had great response. Blood pressure improved. Edema improved. Feels better overall. Blood pressure well controlled. No dizziness.  She has had some problems with insomnia. She felt Celexa made her more sedated. She stopped this on her own after taking this for about a week.  She has had problems off and on with seborrheic dermititis involving the scalp and face. Pruritic scaly rash. Has tried dandruff shampoos without improvement. She has not tried Nizoral.  Past Medical History  Diagnosis Date  . HYPERTENSION 02/28/2010  . Headache 02/28/2010  . Seizures    No past surgical history on file.  reports that she has never smoked. She does not have any smokeless tobacco history on file. Her alcohol and drug histories not on file. family history includes Alcohol abuse in her brother; Diabetes in her father, maternal grandfather, and mother; Hypertension in her father, mother, and sister; Mental illness in her brother, father, and mother; Stroke in her mother; and Sudden death (age of onset:48) in her maternal grandfather. Allergies  Allergen Reactions  . Bactrim     hives  . Sulfamethoxazole W-Trimethoprim     REACTION: hives     Review of Systems  Constitutional: Negative for fatigue.  Eyes: Negative for visual disturbance.  Respiratory: Negative for cough, chest tightness, shortness of breath and wheezing.   Cardiovascular: Negative for chest pain, palpitations and leg swelling.  Neurological: Negative for dizziness, seizures, syncope, weakness, light-headedness and headaches.       Objective:   Physical Exam  Constitutional: She appears well-developed and well-nourished.  Neck: Neck supple. No thyromegaly present.  Cardiovascular: Normal rate and regular rhythm.  Exam reveals no gallop.   No murmur heard. Pulmonary/Chest: Effort normal and breath sounds normal. No respiratory distress. She has no wheezes. She has no rales.  Musculoskeletal: She exhibits no edema.  Skin:       Patient has generally dry skin with several areas of scaling involving the face. She has significant scaling and dryness hairline anterior and occipital region          Assessment & Plan:  #1 hypertension improved. Continue lisinopril HCTZ. Continue weight loss efforts. Potassium rich diet #2 recent situational stressors/insomnia. Patient is currently receiving counseling and improving. Avoid further medications at this time #3 dandruff/seborrheic dermatitis. Elocon lotion to use daily as needed. Try Nizoral shampoo.

## 2012-07-05 NOTE — Patient Instructions (Addendum)
Seborrheic Dermatitis Seborrheic dermatitis involves pink or red skin with greasy, flaky scales. This is often found on the scalp, eyebrows, nose, bearded area, and on or behind the ears. It can also occur on the central chest. It often occurs where there are more oil (sebaceous) glands. This condition is also known as dandruff. When this condition affects a baby's scalp, it is called cradle cap. It may come and go for no known reason. It can occur at any time of life from infancy to old age. CAUSES  The cause is unknown. It is not the result of too little moisture or too much oil. In some people, seborrheic dermatitis flare-ups seem to be triggered by stress. It also commonly occurs in people with certain diseases such as Parkinson's disease or HIV/AIDS. SYMPTOMS   Thick scales on the scalp.   Redness on the face or in the armpits.   The skin may seem oily or dry, but moisturizers do not help.   In infants, seborrheic dermatitis appears as scaly redness that does not seem to bother the baby. In some babies, it affects only the scalp. In others, it also affects the neck creases, armpits, groin, or behind the ears.   In adults and adolescents, seborrheic dermatitis may affect only the scalp. It may look patchy or spread out, with areas of redness and flaking. Other areas commonly affected include:   Eyebrows.   Eyelids.   Forehead.   Skin behind the ears.   Outer ears.   Chest.   Armpits.   Nose creases.   Skin creases under the breasts.   Skin between the buttocks.   Groin.   Some adults and adolescents feel itching or burning in the affected areas.  DIAGNOSIS  Your caregiver can usually tell what the problem is by doing a physical exam. TREATMENT   Cortisone (steroid) ointments, creams, and lotions can help decrease inflammation.   Babies can be treated with baby oil to soften the scales, then they may be washed with baby shampoo. If this does not help, medicated  shampoos should work.   Adults can also use medicated shampoos.   Your caregiver may prescribe corticosteroid cream and shampoo containing an antifungal or yeast medicine (ketoconazole). Hydrocortisone or anti-yeast cream can be rubbed directly onto seborrheic dermatitis patches. Yeast does not cause seborrheic dermatitis, but it seems to add to the problem.  In infants, seborrheic dermatitis is often worst during the first year of life. It tends to disappear on its own as the child grows. However, it may return during the teenage years. In adults and adolescents, seborrheic dermatitis tends to be a long-lasting condition that comes and goes over many years. HOME CARE INSTRUCTIONS   Use prescribed medicines as directed.   In infants, do not aggressively remove the scales or flakes on the scalp with a comb or by other means. This may lead to hair loss.  SEEK MEDICAL CARE IF:   The problem does not improve from the medicated shampoos, lotions, or other medicines given by your caregiver.   You have any other questions or concerns.  Document Released: 11/16/2005 Document Revised: 11/05/2011 Document Reviewed: 04/07/2010 [employer] Patient Information 2012 [employer]. 

## 2012-08-30 ENCOUNTER — Telehealth: Payer: Self-pay | Admitting: *Deleted

## 2012-08-30 NOTE — Telephone Encounter (Signed)
OV Aug 6 and celexa was discussed.  I made her groggy in the morning, so she had stopped taking it.  Pt is moving to [facility] and still has anxiety situations and is asking if there is another Rx she could try more as an as needed med for those anxiety times [phone]

## 2012-08-30 NOTE — Telephone Encounter (Signed)
I would NOT recommend benzodiazepines as these can become habit forming and very difficult to stop.  Any of the as needed medications would fall in this category.  I think if she [place] unable to tolerate SSRIs such as Celexa would make sure she has had good counseling.  We could try another SSRI but these require daily use.

## 2012-08-31 MED ORDER — ESCITALOPRAM OXALATE 10 MG PO TABS: 10.0000 mg | ORAL_TABLET | Freq: Every day | ORAL | Status: DC

## 2012-08-31 NOTE — Telephone Encounter (Signed)
LMTCB

## 2012-08-31 NOTE — Telephone Encounter (Signed)
VM received back from pt, her neurologist advised her not to take Wellbutrin and she does not want to take zoloft.  She is wiling to take the med every day.

## 2012-08-31 NOTE — Telephone Encounter (Signed)
Let's try Lexapro 10 mg once daily and pt will need follow up within 3-4 weeks to reassess.  Fill #30 with no additional refills.

## 2012-08-31 NOTE — Telephone Encounter (Signed)
Pt informed, Rx sent. 

## 2012-09-01 ENCOUNTER — Telehealth: Payer: Self-pay | Admitting: Family Medicine

## 2012-09-01 NOTE — Telephone Encounter (Signed)
Pt was encouraged to price some other pharmacies, and she will call back with pharmacy name, etc

## 2012-09-01 NOTE — Telephone Encounter (Signed)
Pt called and said that Lexapro is too expensive. Pt req a generic or cheaper alternative. [facility] on [address] in [place], [place].

## 2012-09-01 NOTE — Telephone Encounter (Signed)
Lexapro is now generic.

## 2012-09-05 NOTE — Telephone Encounter (Signed)
Let's try fluoxetine 20 mg once daily disp #30 with one refill but she will need to establish with someone in [employer] (unless she comes back here) for follow up in 3-4 weeks.

## 2012-09-05 NOTE — Telephone Encounter (Signed)
Pt stated she call several pharm generic lexapro is still expensive

## 2012-09-06 MED ORDER — FLUOXETINE HCL 20 MG PO CAPS: 20.0000 mg | ORAL_CAPSULE | Freq: Every day | ORAL | Status: DC

## 2012-09-06 NOTE — Telephone Encounter (Signed)
Pt informed and she voiced her understanding, scheduling F/U OV now

## 2012-10-05 ENCOUNTER — Encounter: Payer: Self-pay | Admitting: Family Medicine

## 2012-10-05 ENCOUNTER — Ambulatory Visit (INDEPENDENT_AMBULATORY_CARE_PROVIDER_SITE_OTHER): Payer: Self-pay | Admitting: Family Medicine

## 2012-10-05 VITALS — BP 120/82 | Temp 98.0°F | Wt 200.0 lb

## 2012-10-05 DIAGNOSIS — I10 Essential (primary) hypertension: Secondary | ICD-10-CM

## 2012-10-05 DIAGNOSIS — G4700 Insomnia, unspecified: Secondary | ICD-10-CM

## 2012-10-05 DIAGNOSIS — F4321 Adjustment disorder with depressed mood: Secondary | ICD-10-CM

## 2012-10-05 LAB — BASIC METABOLIC PANEL
BUN: 9 mg/dL (ref 6–23)
CO2: 25 mEq/L (ref 19–32)
Chloride: 107 mEq/L (ref 96–112)
Creatinine, Ser: 0.8 mg/dL (ref 0.4–1.2)
GFR: 119.14 mL/min (ref >60.00)
Glucose, Bld: 98 mg/dL (ref 70–99)
Potassium: 3.8 mEq/L (ref 3.5–5.1)
Sodium: 138 mEq/L (ref 135–145)

## 2012-10-05 MED ORDER — FLUOXETINE HCL 40 MG PO CAPS: 40.0000 mg | ORAL_CAPSULE | Freq: Every day | ORAL | Status: DC

## 2012-10-05 MED ORDER — ZOLPIDEM TARTRATE 10 MG PO TABS: 10.0000 mg | ORAL_TABLET | Freq: Every evening | ORAL | Status: DC | PRN

## 2012-10-05 NOTE — Patient Instructions (Signed)

## 2012-10-05 NOTE — Progress Notes (Signed)
  Subjective:    Patient ID: Anna Rhodes, female    DOB: July 24, 1986, 26 y.o.   MRN: [id number]  HPI  Patient here issues regarding depression. Her father passed away from complications of stroke of one month ago. We started fluoxetine 20 mg daily. Overall improved but still having significant sleep difficulties. Difficulty falling asleep. Overall her crying spells are decreasing. She is working part-time. Very supportive family. She has appointment scheduled with grief counselor  Difficulty falling asleep. Occasional daytime naps. Rare caffeine use. No alcohol use.  Took Benadryl but had hangover drowsiness. Melatonin without much improvement. She's also had more frequent migraine headaches since she's had less sleep.  Hypertension has been well controlled. No headaches. No dizziness. No peripheral edema issues. Compliant with therapy.  Past Medical History  Diagnosis Date  . HYPERTENSION 02/28/2010  . Headache 02/28/2010  . Seizures    No past surgical history on file.  reports that she has never smoked. She does not have any smokeless tobacco history on file. Her alcohol and drug histories not on file. family history includes Alcohol abuse in her brother; Diabetes in her father, maternal grandfather, and mother; Hypertension in her father, mother, and sister; Mental illness in her brother, father, and mother; Stroke in her mother; and Sudden death (age of onset:48) in her maternal grandfather. Allergies  Allergen Reactions  . Bactrim     hives  . Sulfamethoxazole W-Trimethoprim     REACTION: hives      Review of Systems  Constitutional: Negative for fatigue.  Eyes: Negative for visual disturbance.  Respiratory: Negative for cough, chest tightness, shortness of breath and wheezing.   Cardiovascular: Negative for chest pain, palpitations and leg swelling.  Neurological: Negative for dizziness, seizures, syncope, weakness, light-headedness and headaches.  Psychiatric/Behavioral:  Positive for sleep disturbance and dysphoric mood. Negative for suicidal ideas, confusion and agitation.       Objective:   Physical Exam  Constitutional: She is oriented to person, place, and time. She appears well-developed and well-nourished.  Neck: Neck supple. No thyromegaly present.  Cardiovascular: Normal rate and regular rhythm.   No murmur heard. Pulmonary/Chest: Effort normal and breath sounds normal. No respiratory distress. She has no wheezes. She has no rales.  Musculoskeletal: She exhibits no edema.  Neurological: She is alert and oriented to person, place, and time.  Psychiatric: She has a normal mood and affect. Her behavior is normal. Thought content normal.          Assessment & Plan:  #1 adjustment disorder with depressed mood. Continue counseling as scheduled. Titrate fluoxetine to 40 mg once daily and touch base 2-3 weeks with feedback. #2 hypertension. Stable. Check basic metabolic panel #3 insomnia. Sleep hygiene discussed handout given. Short-term only used Ambien 10 mg each bedtime as needed

## 2012-10-06 NOTE — Progress Notes (Signed)
Quick Note:  Pt informed on VM ______ 

## 2013-02-15 ENCOUNTER — Ambulatory Visit (INDEPENDENT_AMBULATORY_CARE_PROVIDER_SITE_OTHER): Payer: 59 | Admitting: Internal Medicine

## 2013-02-15 ENCOUNTER — Encounter: Payer: Self-pay | Admitting: Internal Medicine

## 2013-02-15 ENCOUNTER — Telehealth: Payer: Self-pay | Admitting: Family Medicine

## 2013-02-15 VITALS — BP 130/78 | HR 68 | Temp 97.6°F | Wt 216.0 lb

## 2013-02-15 DIAGNOSIS — R109 Unspecified abdominal pain: Secondary | ICD-10-CM

## 2013-02-15 LAB — BASIC METABOLIC PANEL
Calcium: 9 mg/dL (ref 8.4–10.5)
Chloride: 105 mEq/L (ref 96–112)
Creatinine, Ser: 0.6 mg/dL (ref 0.4–1.2)
GFR: 145.3 mL/min (ref >60.00)
Potassium: 4.1 mEq/L (ref 3.5–5.1)

## 2013-02-15 LAB — HEPATIC FUNCTION PANEL
Bilirubin, Direct: 0 mg/dL (ref 0.0–0.3)
Total Protein: 7.7 g/dL (ref 6.0–8.3)

## 2013-02-15 LAB — POCT URINALYSIS DIPSTICK
Ketones, UA: NEGATIVE
Protein, UA: NEGATIVE
Urobilinogen, UA: 0.2

## 2013-02-15 LAB — CBC WITH DIFFERENTIAL/PLATELET
Basophils Absolute: 0 10*3/uL (ref 0.0–0.1)
Basophils Relative: 0.2 % (ref 0.0–3.0)
Eosinophils Relative: 0 % (ref 0.0–5.0)
HCT: 41.3 % (ref 36.0–46.0)
Lymphs Abs: 1.5 10*3/uL (ref 0.7–4.0)
Monocytes Relative: 5.6 % (ref 3.0–12.0)
Neutro Abs: 7.9 10*3/uL — ABNORMAL HIGH (ref 1.4–7.7)
RBC: 4.66 Mil/uL (ref 3.87–5.11)
RDW: 13.7 % (ref 11.5–14.6)
WBC: 9.9 10*3/uL (ref 4.5–10.5)

## 2013-02-15 LAB — SEDIMENTATION RATE: Sed Rate: 24 mm/hr — ABNORMAL HIGH (ref 0–22)

## 2013-02-15 MED ORDER — POLYETHYLENE GLYCOL 3350 17 GM/SCOOP PO POWD: 17.0000 g | Freq: Two times a day (BID) | ORAL | Status: DC | PRN

## 2013-02-15 MED ORDER — HYDROCODONE-ACETAMINOPHEN 5-325 MG PO TABS: 1.0000 | ORAL_TABLET | Freq: Three times a day (TID) | ORAL | Status: DC | PRN

## 2013-02-15 NOTE — Progress Notes (Signed)
Subjective:    Patient ID: Anna Rhodes, female    DOB: 11-Jul-1986, 27 y.o.   MRN: [id number]  HPI  27 year old African American female complains of nausea and headache for 2 weeks. Patient has history of migraines but headaches feel different than her previous migraine headaches. Patient reports should her symptoms initially started with unexplained nausea. She was seen in urgent care last week. Her UA and CBC was unremarkable. Patient also complained of shortness of breath.  Patient reports workup for pulmonary embolism completed. It was negative. She complains of mid abdominal discomfort. No vomiting. She has constipation. Her urine pregnancy during your visit was also negative.  She denies any sinus congestion or pain. She denies any visual changes. Her headache is localized to the left side of her head.  Review of Systems Negative for dysuria.  Constipation.    Past Medical History  Diagnosis Date  . HYPERTENSION 02/28/2010  . Headache 02/28/2010  . Seizures     History   Social History  . Marital Status: Single    Spouse Name: N/A    Number of Children: N/A  . Years of Education: N/A   Occupational History  . Not on file.   Social History Main Topics  . Smoking status: Never Smoker   . Smokeless tobacco: Not on file  . Alcohol Use: Not on file  . Drug Use: Not on file  . Sexually Active: Not on file   Other Topics Concern  . Not on file   Social History Narrative  . No narrative on file    No past surgical history on file.  Family History  Problem Relation Age of Onset  . Stroke Mother   . Hypertension Mother   . Mental illness Mother   . Diabetes Mother   . Hypertension Father   . Mental illness Father   . Diabetes Father   . Hypertension Sister   . Alcohol abuse Brother   . Mental illness Brother   . Sudden death Maternal Grandfather 52  . Diabetes Maternal Grandfather     Allergies  Allergen Reactions  . Bactrim     hives  . Sulfamethoxazole  W-Trimethoprim     REACTION: hives    Current Outpatient Prescriptions on File Prior to Visit  Medication Sig Dispense Refill  . FLUoxetine (PROZAC) 40 MG capsule Take 1 capsule (40 mg total) by mouth daily.  30 capsule  5  . lisinopril-hydrochlorothiazide (ZESTORETIC) 20-12.5 MG per tablet Take 1 tablet by mouth daily.  30 tablet  11  . mometasone (ELOCON) 0.1 % lotion Apply topically daily.  60 mL  4  . topiramate (TOPAMAX) 100 MG tablet Take 50 mg by mouth 2 (two) times daily.        No current facility-administered medications on file prior to visit.    BP 130/78  Pulse 68  Temp(Src) 97.6 F (36.4 C) (Oral)  Wt 216 lb (97.977 kg)  BMI 35.94 kg/m2       Objective:   Physical Exam  Constitutional: She is oriented to person, place, and time. She appears well-developed and well-nourished.  HENT:  Head: Normocephalic and atraumatic.  Right Ear: External ear normal.  Left Ear: External ear normal.  Mouth/Throat: Oropharynx is clear and moist.  Eyes: Conjunctivae and EOM are normal. Pupils are equal, round, and reactive to light.  Cardiovascular: Normal rate, regular rhythm and normal heart sounds.   No murmur heard. Pulmonary/Chest: Effort normal. She has no wheezes.  Abdominal: Soft. Bowel  sounds are normal. She exhibits no mass. There is no rebound and no guarding.  RLQ and epigastric tenderness  Neurological: She is alert and oriented to person, place, and time. No cranial nerve deficit.  Skin: Skin is warm and dry.  Psychiatric: She has a normal mood and affect. Her behavior is normal.          Assessment & Plan:

## 2013-02-15 NOTE — Telephone Encounter (Signed)
Patient Information:  Caller Name: Anna Rhodes  Phone: [phone]  Patient: Anna Rhodes  Gender: Female  DOB: 1986/06/26  Age: 27 Years  PCP: Anna Rhodes [facility])  Pregnant: No  Office Follow Up:  Does the office need to follow up with this patient?: No  Instructions For The Office: N/A   Symptoms  Reason For Call & Symptoms: Headache w/nausea and lightheaded for 2 wks.  No changes in medication.    Reviewed Health History In EMR: Yes  Reviewed Medications In EMR: Yes  Reviewed Allergies In EMR: Yes  Reviewed Surgeries / Procedures: Yes  Date of Onset of Symptoms: 02/01/2013  Treatments Tried: Zofran  Ibuprofen  Treatments Tried Worked: No OB / GYN:  LMP: 01/24/2013  Guideline(s) Used:  Headache  Disposition Per Guideline:   See Today or Tomorrow in Office  Reason For Disposition Reached:   Unexplained headache that is present > 24 hours  Advice Given:  Rest:   Lie down in a dark, quiet place and try to relax. Close your eyes and imagine your entire body relaxing.  Apply Cold to the Area:   Apply a cold wet washcloth or cold pack to the forehead for 20 minutes.  Call Back If:  You become worse. Have someone drive to the office.   Patient Will Follow Care Advice:  YES  Appointment Scheduled:  02/15/2013 14:15:00 Appointment Scheduled Provider:  Artist Rhodes, Doe-Hyun Molly Maduro) (Adults only)

## 2013-02-15 NOTE — Assessment & Plan Note (Signed)
27 year old African American female complains of unexplained nausea and abdominal pain. She was seen in urgent care last week. Initial workup was unremarkable. Repeat urinalysis. Check serum pregnancy test. Check LFTs, sed rate and obtain abdominal ultrasound. Patient's symptoms may be secondary to transient viral gastroenteritis. Continue Zofran 4 nausea as needed.

## 2013-02-15 NOTE — Patient Instructions (Addendum)
Increase fluid intake Follow up with Dr. Caryl Never within 1 week if your headache has not improved.

## 2013-02-15 NOTE — Assessment & Plan Note (Signed)
The patient has left-sided headache. It is unlike her previous migraines. Her symptoms associated with abdominal discomfort and nausea. Neuro exam is normal. Her symptoms may be secondary to viral etiology.  Increase fluid intake. Use hydrocodone/aPAP 5/325 as directed.

## 2013-02-16 ENCOUNTER — Telehealth: Payer: Self-pay | Admitting: Internal Medicine

## 2013-02-16 NOTE — Telephone Encounter (Signed)
Pt called to inquire about her lab results. She was avid that she needed these results before the end of today, in order to "get on the road"... Please assist.

## 2013-02-16 NOTE — Telephone Encounter (Signed)
Call pt - blood work was negative for any acute findings. Serum pregnancy was negative.  I suggest she continue zofran as needed for nausea.  Proceed with abdominal u/s.

## 2013-02-16 NOTE — Telephone Encounter (Signed)
Patient is aware of lab results.  She states that her GYN office called and her glucose is elevated.  They would like to have a fasting glucose? Also the Zofran is causing headaches and she is not feeling any better because she still has nausea and vomiting.  Please advise.

## 2013-02-16 NOTE — Telephone Encounter (Signed)
Pt would like results of labs done by Dr Artist Pais yesterday.

## 2013-02-16 NOTE — Telephone Encounter (Signed)
Labs mostly unremarkable.   Glucose was 100 which is borderline prediabetes-if this was fasting. We can get follow up fasting glucose but this is not an acute issue Needs to go ahead and get ultrasound.

## 2013-02-16 NOTE — Telephone Encounter (Signed)
Please advise 

## 2013-02-17 NOTE — Telephone Encounter (Signed)
Patient states she went to ER last night and had a [place] and it was normal.  They suggested that she is evaluated by a GI doctor.  I instructed the patient to bring a copy of the [place] for review and further direction.  FYI.

## 2013-02-21 NOTE — Telephone Encounter (Signed)
I suggest pt see Dr Caryl Never within 1 week

## 2013-02-21 NOTE — Telephone Encounter (Signed)
Left detailed message on machine for patient to call back and schedule an appointment

## 2013-05-19 ENCOUNTER — Encounter: Payer: Self-pay | Admitting: Neurology

## 2013-05-19 ENCOUNTER — Telehealth: Payer: Self-pay

## 2013-05-19 NOTE — Telephone Encounter (Signed)
Pt left aa VM making inquiry about her DMV forms and getting them completed. Patient has her first OV with [place] on 05/22/2013. I called her back and left a VM asking that she speak with me about this when she is here on Monday, as I do not have her forms.

## 2013-05-22 ENCOUNTER — Ambulatory Visit: Payer: Self-pay | Admitting: Diagnostic Neuroimaging

## 2013-05-22 ENCOUNTER — Telehealth: Payer: Self-pay | Admitting: Family Medicine

## 2013-05-22 NOTE — Telephone Encounter (Signed)
LMOM with contact name and number for return call RE: clarification on paperwork/SLS

## 2013-05-22 NOTE — Telephone Encounter (Signed)
Concerning paperwork about her physical/kh

## 2013-06-01 NOTE — Telephone Encounter (Signed)
Left message for patient to return call.

## 2013-06-27 ENCOUNTER — Encounter: Payer: Self-pay | Admitting: Diagnostic Neuroimaging

## 2013-06-27 ENCOUNTER — Ambulatory Visit (INDEPENDENT_AMBULATORY_CARE_PROVIDER_SITE_OTHER): Payer: 59 | Admitting: Diagnostic Neuroimaging

## 2013-06-27 VITALS — BP 122/91 | HR 83 | Temp 97.7°F | Ht 66.0 in | Wt 195.5 lb

## 2013-06-27 DIAGNOSIS — G43909 Migraine, unspecified, not intractable, without status migrainosus: Secondary | ICD-10-CM

## 2013-06-27 DIAGNOSIS — G40909 Epilepsy, unspecified, not intractable, without status epilepticus: Secondary | ICD-10-CM

## 2013-06-27 NOTE — Patient Instructions (Addendum)
Forms sent to DMV.

## 2013-06-27 NOTE — Progress Notes (Signed)
[facility]  PATIENT: Anna Rhodes DOB: [birth year]  REFERRING CLINICIAN:  HISTORY FROM: patient REASON FOR VISIT: follow up   HISTORICAL  CHIEF COMPLAINT:  Chief Complaint  Patient presents with  . Follow-up    seizures    HISTORY OF PRESENT ILLNESS:   UPDATE 06/27/13: Since last visit, doing well. No seizures. HA stable. On TPX 50 / 100. No side effects. Did go to ER recently for unexplained abdominal pain, but it has improved. On birth control for ovarian cyst. Asking about prenatal planning options.  UPDATE 11/16/12: Doing well without seizures.  Having intermittent sharp pains left side of head.  Photophobia, no nausea or vomiting.  Pain 7.5/10.    UPDATE 02/29/12: Doing well. No sz since 2004. Memory still poor subjectively.  UPDATE 11/12/10: No seizures.  Having worsening left sided sharp headaches; sometimes throbbing.  Now 8.5/10.  No nausea/vomiting.  Feels confused.  Spacing out.  Had episode at work, with headache, unable to speak, but could hear everything.  UPDATE 09/09/10:  27 year old female.  No seizures since last visit. She reports last seizure was 7 years ago (2004).  Over the past 2 months she reports increasing episodes of memory difficulty, confusion and headaches.  She denies any significant change in her diet, physical activity, home or work situation. She denies depression or anxiety. She was started on lisinopril 20 mg per day 2 months ago for hypertension.   PRIOR HPI: 27 year old female with history of seizure disorder and migraine headaches presenting to establish care with local neurologist.  Patient's previous evaluated in [place] at the [facility] (Dr. Excell Seltzer), then Dr. Laurelyn Sickle in [place], and now has moved back to [place].  Her seizure disorder dates back to the age of 27 years old. She had a grand mal seizure with no aura. She was initially treated with Tegretol and Keppra and Lamictal. She  was well controlled on Lamictal for a long time until she developed dark spots on her skin. Approximately one year ago she was switched to Topamax 100 mg daily.  Her last seizure was 6 years ago. She has no side effects from Topamax.  She was previously diagnosed with migraine headaches (throbbing pain, phonophobia; denies nausea, vomiting, visual changes or photophobia).  She used to take ibuprofen 600 mg p.r.n. with good relief.  Her headaches were controlled for many years. Over the last several weeks she's had recurrence of milder headaches. She is complaining of difficulty sleeping (able to fall asleep but wakes up in the middle of the night).  She denies snoring or bleeding trouble. She denies anxiety or depression.   REVIEW OF SYSTEMS: Full 14 system review of systems performed and notable only for nothing.  ALLERGIES: Allergies  Allergen Reactions  . Bactrim     hives  . Sulfamethoxazole W-Trimethoprim     REACTION: hives    HOME MEDICATIONS: Outpatient Prescriptions Prior to Visit  Medication Sig Dispense Refill  . FLUoxetine (PROZAC) 40 MG capsule Take 1 capsule (40 mg total) by mouth daily.  30 capsule  5  . mometasone (ELOCON) 0.1 % lotion Apply topically daily.  60 mL  4  . topiramate (TOPAMAX) 100 MG tablet Take 50 mg by mouth 2 (two) times daily.       Marland Kitchen HYDROcodone-acetaminophen (NORCO/VICODIN) 5-325 MG per tablet Take 1 tablet by mouth every 8 (eight) hours as needed for pain.  30 tablet  0  . lisinopril-hydrochlorothiazide (ZESTORETIC) 20-12.5 MG per tablet  Take 1 tablet by mouth daily.  30 tablet  11  . polyethylene glycol powder (GLYCOLAX/MIRALAX) powder Take 17 g by mouth 2 (two) times daily as needed.  850 g  0   No facility-administered medications prior to visit.    PAST MEDICAL HISTORY: Past Medical History  Diagnosis Date  . HYPERTENSION 02/28/2010  . Headache(784.0) 02/28/2010  . Seizures     PAST SURGICAL HISTORY: History reviewed. No pertinent past  surgical history.  FAMILY HISTORY: Family History  Problem Relation Age of Onset  . Stroke Mother   . Hypertension Mother   . Mental illness Mother   . Diabetes Mother   . Hypertension Father   . Mental illness Father   . Diabetes Father   . Hypertension Sister   . Alcohol abuse Brother   . Mental illness Brother   . Sudden death Maternal Grandfather 62  . Diabetes Maternal Grandfather     SOCIAL HISTORY:  History   Social History  . Marital Status: Single    Spouse Name: N/A    Number of Children: 0  . Years of Education: MA   Occupational History  . Counselor     Film/video editor   Social History Main Topics  . Smoking status: Never Smoker   . Smokeless tobacco: Never Used  . Alcohol Use: No  . Drug Use: No  . Sexually Active: Not on file   Other Topics Concern  . Not on file   Social History Narrative   Patient lives at home alone.   Caffeine Use: 1 cup every other day     PHYSICAL EXAM  Filed Vitals:   06/27/13 0818  BP: 122/91  Pulse: 83  Temp: 97.7 F (36.5 C)  TempSrc: Oral  Height: 5\' 6"  (1.676 m)  Weight: 195 lb 8 oz (88.678 kg)    Not recorded    Body mass index is 31.57 kg/(m^2).  GENERAL EXAM: Patient is in no distress  CARDIOVASCULAR: Regular rate and rhythm, no murmurs, no carotid bruits  NEUROLOGIC: MENTAL STATUS: awake, alert, language fluent, comprehension intact, naming intact CRANIAL NERVE: no papilledema on fundoscopic exam, pupils equal and reactive to light, visual fields full to confrontation, extraocular muscles intact, no nystagmus, facial sensation and strength symmetric, uvula midline, shoulder shrug symmetric, tongue midline. MOTOR: normal bulk and tone, full strength in the BUE, BLE SENSORY: normal and symmetric to light touch COORDINATION: finger-nose-finger, fine finger movements normal REFLEXES: deep tendon reflexes present and symmetric GAIT/STATION: narrow based gait; able to tandem; romberg is  negative   DIAGNOSTIC DATA (LABS, IMAGING, TESTING) - I reviewed patient records, labs, notes, testing and imaging myself where available.  Lab Results  Component Value Date   WBC 9.9 02/15/2013   HGB 13.4 02/15/2013   HCT 41.3 02/15/2013   MCV 88.8 02/15/2013   PLT 330.0 02/15/2013      Component Value Date/Time   NA 137 02/15/2013 1457   K 4.1 02/15/2013 1457   CL 105 02/15/2013 1457   CO2 27 02/15/2013 1457   GLUCOSE 100* 02/15/2013 1457   BUN 7 02/15/2013 1457   CREATININE 0.6 02/15/2013 1457   CALCIUM 9.0 02/15/2013 1457   PROT 7.7 02/15/2013 1457   ALBUMIN 3.8 02/15/2013 1457   AST 19 02/15/2013 1457   ALT 20 02/15/2013 1457   ALKPHOS 74 02/15/2013 1457   BILITOT 0.4 02/15/2013 1457   GFRNONAA 119.44 06/27/2010 1139   No results found for this basename: CHOL, HDL, LDLCALC, LDLDIRECT, TRIG,  CHOLHDL   No results found for this basename: HGBA1C   No results found for this basename: VITAMINB12   Lab Results  Component Value Date   TSH 1.67 05/05/2011   09/09/10 TSH 1.35 09/09/10 B12 257 11/14/10 EEG - normal   ASSESSMENT AND PLAN  27 y.o. right-handed female with seizure disorder and migraine headaches.  Her seizure disorder (type? primary generalized?) is extremely well-controlled on Topamax.  Having intermittent memory difficulty since 2011; could be related to TPX.    PLAN: 1. Continue TPX 50mg  in am and 100mg  qhs for seizure and migraine prevention 2. Discussed prenatal planning options; she should start prenatal vitamins and folic acid under supervision of ob/gyn. Also we could consider lowering or tapering off TPX in preparation. Highest risk of TPX for the fetus is in the 1st trimester, as with most medications. She is not actively planning pregnancy at this time, but just getting information for the future.   Suanne Marker, MD 06/27/2013, 8:57 AM Certified in Neurology, Neurophysiology and Neuroimaging  [facility] [address] [place], [place] [address] [phone]

## 2013-07-24 ENCOUNTER — Other Ambulatory Visit: Payer: Self-pay | Admitting: Family Medicine

## 2013-08-04 ENCOUNTER — Telehealth: Payer: Self-pay | Admitting: *Deleted

## 2013-08-04 ENCOUNTER — Telehealth: Payer: Self-pay

## 2013-08-04 NOTE — Telephone Encounter (Signed)
Called patient. Advised would forward message to RN who handles forms. Patient agreed

## 2013-08-04 NOTE — Telephone Encounter (Signed)
Please call this patient. She states that her DMV form was not complete. She states the [facility] said that the entire page 2 with Dr. Richrd Humbles address and dates were not found. Please call Anna Rhodes

## 2013-08-04 NOTE — Telephone Encounter (Signed)
I called after receiving a message about DMV form. I left VM that I added the doctor's address and marked no to each category on page two. I am faxing DMV document agaon now. I am concerned that there is a component on page one that requires the patient to complete and that is not done. Please advise what you would like to do about this. I will leave a copy up front

## 2013-08-11 NOTE — Telephone Encounter (Signed)
I received a call asking if disability forms were sent to [facility] about a month ago. Not forms have arrived.

## 2013-12-04 ENCOUNTER — Telehealth: Payer: Self-pay | Admitting: Diagnostic Neuroimaging

## 2013-12-04 MED ORDER — TOPIRAMATE 50 MG PO TABS: ORAL_TABLET | ORAL | Status: DC

## 2013-12-04 NOTE — Telephone Encounter (Signed)
Spoke with patient and she is requesting a refill on topiramate- 100mg ,has a July f/u appt.scheduled

## 2013-12-04 NOTE — Telephone Encounter (Signed)
Patient wants to know if she needs a sooner appointment than July for medication refills. Last time she was in she did not get a prescription. Please call to advise. GB

## 2013-12-06 ENCOUNTER — Ambulatory Visit: Payer: 59 | Admitting: Neurology

## 2013-12-25 ENCOUNTER — Telehealth: Payer: Self-pay | Admitting: *Deleted

## 2013-12-25 NOTE — Telephone Encounter (Signed)
Spoke with patient and she said that her thought process is slowing down, short term memory is really bad. Could it be from the topiramate, had started taking Norvasc-10mg  for B/P for 2 months, was discont. By pcp and changed to new medicine(not sure of name).

## 2013-12-26 ENCOUNTER — Encounter: Payer: Self-pay | Admitting: Diagnostic Neuroimaging

## 2013-12-26 ENCOUNTER — Ambulatory Visit (INDEPENDENT_AMBULATORY_CARE_PROVIDER_SITE_OTHER): Payer: 59 | Admitting: Diagnostic Neuroimaging

## 2013-12-26 ENCOUNTER — Encounter (INDEPENDENT_AMBULATORY_CARE_PROVIDER_SITE_OTHER): Payer: Self-pay

## 2013-12-26 VITALS — BP 149/108 | HR 94 | Ht 66.0 in | Wt 206.0 lb

## 2013-12-26 DIAGNOSIS — G40909 Epilepsy, unspecified, not intractable, without status epilepticus: Secondary | ICD-10-CM

## 2013-12-26 NOTE — Progress Notes (Signed)
[facility]  PATIENT: Anna Rhodes DOB: [birth year]  REFERRING CLINICIAN:  HISTORY FROM: patient REASON FOR VISIT: follow up   HISTORICAL  CHIEF COMPLAINT:  Chief Complaint  Patient presents with  . Follow-up    sz d/o    HISTORY OF PRESENT ILLNESS:   UPDATE 12/26/13: Since last visit, no seizures. Has been having more cognitive difficulty, and she thinks it is related to starting norvasc. She stopped it yesterday, and now her PCP has rx'd atenolol+chlorthalidone. She is concerned about medication side effects. She has relocated to [facility] and looking to transfer care.  UPDATE 06/27/13: Since last visit, doing well. No seizures. HA stable. On TPX 50 / 100. No side effects. Did go to ER recently for unexplained abdominal pain, but it has improved. On birth control for ovarian cyst. Asking about prenatal planning options.  UPDATE 11/16/12: Doing well without seizures.  Having intermittent sharp pains left side of head.  Photophobia, no nausea or vomiting.  Pain 7.5/10.    UPDATE 02/29/12: Doing well. No sz since 2004. Memory still poor subjectively.  UPDATE 11/12/10: No seizures.  Having worsening left sided sharp headaches; sometimes throbbing.  Now 8.5/10.  No nausea/vomiting.  Feels confused.  Spacing out.  Had episode at work, with headache, unable to speak, but could hear everything.  UPDATE 09/09/10:  28 year old female.  No seizures since last visit. She reports last seizure was 7 years ago (2004).  Over the past 2 months she reports increasing episodes of memory difficulty, confusion and headaches.  She denies any significant change in her diet, physical activity, home or work situation. She denies depression or anxiety. She was started on lisinopril 20 mg per day 2 months ago for hypertension.  PRIOR HPI: 28 year old female with history of seizure disorder and migraine headaches presenting to establish care with local neurologist. Patient's previous  evaluated in [place] at the [facility] (Dr. Excell Seltzer), then Dr. Laurelyn Sickle in [place], and now has moved back to [place].  Her seizure disorder dates back to the age of 28 years old. She had a grand mal seizure with no aura. She was initially treated with Tegretol and Keppra and Lamictal. She was well controlled on Lamictal for a long time until she developed dark spots on her skin. Approximately one year ago she was switched to Topamax 100 mg daily.  Her last seizure was 6 years ago. She has no side effects from Topamax.  She was previously diagnosed with migraine headaches (throbbing pain, phonophobia; denies nausea, vomiting, visual changes or photophobia).  She used to take ibuprofen 600 mg p.r.n. with good relief.  Her headaches were controlled for many years. Over the last several weeks she's had recurrence of milder headaches. She is complaining of difficulty sleeping (able to fall asleep but wakes up in the middle of the night).  She denies snoring or bleeding trouble. She denies anxiety or depression.   REVIEW OF SYSTEMS: Full 14 system review of systems performed and notable only for memory loss, dizziness, confusion.  ALLERGIES: Allergies  Allergen Reactions  . Bactrim     hives  . Sulfamethoxazole-Trimethoprim     REACTION: hives    HOME MEDICATIONS: Outpatient Prescriptions Prior to Visit  Medication Sig Dispense Refill  . FLUoxetine (PROZAC) 40 MG capsule Take 1 capsule (40 mg total) by mouth daily.  30 capsule  5  . mometasone (ELOCON) 0.1 % lotion APPLY TO AFFECTED AREA DAILY AS DIRECTED  60 mL  4  . topiramate (TOPAMAX) 50 MG tablet take 50mg  in am and 100mg  qhs  90 tablet  6  . lisinopril (PRINIVIL,ZESTRIL) 20 MG tablet Take 20 mg by mouth daily.      . Norethin Ace-Eth Estrad-FE (LOESTRIN 24 FE PO) Take by mouth daily.       No facility-administered medications prior to visit.    PAST MEDICAL HISTORY: Past Medical History  Diagnosis  Date  . HYPERTENSION 02/28/2010  . Headache(784.0) 02/28/2010  . Seizures     PAST SURGICAL HISTORY: History reviewed. No pertinent past surgical history.  FAMILY HISTORY: Family History  Problem Relation Age of Onset  . Stroke Mother   . Hypertension Mother   . Mental illness Mother   . Diabetes Mother   . Hypertension Father   . Mental illness Father   . Diabetes Father   . Hypertension Sister   . Alcohol abuse Brother   . Mental illness Brother   . Sudden death Maternal Grandfather [id number]  . Diabetes Maternal Grandfather     SOCIAL HISTORY:  History   Social History  . Marital Status: Single    Spouse Name: N/A    Number of Children: 0  . Years of Education: MA   Occupational History  . Counselor     [employer]   Social History Main Topics  . Smoking status: Never Smoker   . Smokeless tobacco: Never Used  . Alcohol Use: No  . Drug Use: No  . Sexual Activity: Not on file   Other Topics Concern  . Not on file   Social History Narrative   Patient lives at home alone.   Caffeine Use: 1 cup every other day     PHYSICAL EXAM  Filed Vitals:   12/26/13 1416  BP: 149/108  Pulse: 94  Height: 5\' 6"  (1.676 m)  Weight: 206 lb (93.441 kg)    Not recorded    Body mass index is 33.27 kg/(m^2).  GENERAL EXAM: Patient is in no distress  CARDIOVASCULAR: Regular rate and rhythm, no murmurs, no carotid bruits  NEUROLOGIC: MENTAL STATUS: awake, alert, language fluent, comprehension intact, naming intact CRANIAL NERVE: pupils equal and reactive to light, visual fields full to confrontation, extraocular muscles intact, no nystagmus, facial sensation and strength symmetric, uvula midline, shoulder shrug symmetric, tongue midline. MOTOR: normal bulk and tone, full strength in the BUE, BLE SENSORY: normal and symmetric to light touch COORDINATION: finger-nose-finger, fine finger movements normal REFLEXES: deep tendon reflexes present and  symmetric GAIT/STATION: narrow based gait; able to tandem; romberg is negative   DIAGNOSTIC DATA (LABS, IMAGING, TESTING) - I reviewed patient records, labs, notes, testing and imaging myself where available.  Lab Results  Component Value Date   WBC 9.9 02/15/2013   HGB 13.4 02/15/2013   HCT 41.3 02/15/2013   MCV 88.8 02/15/2013   PLT 330.0 02/15/2013      Component Value Date/Time   NA 137 02/15/2013 1457   K 4.1 02/15/2013 1457   CL 105 02/15/2013 1457   CO2 27 02/15/2013 1457   GLUCOSE 100* 02/15/2013 1457   BUN 7 02/15/2013 1457   CREATININE 0.6 02/15/2013 1457   CALCIUM 9.0 02/15/2013 1457   PROT 7.7 02/15/2013 1457   ALBUMIN 3.8 02/15/2013 1457   AST 19 02/15/2013 1457   ALT 20 02/15/2013 1457   ALKPHOS 74 02/15/2013 1457   BILITOT 0.4 02/15/2013 1457   GFRNONAA 119.44 06/27/2010 1139   No results found for this basename: CHOL,  HDL,  LDLCALC,  LDLDIRECT,  TRIG,  CHOLHDL   No results found for this basename: HGBA1C   No results found for this basename: VITAMINB12   Lab Results  Component Value Date   TSH 1.67 05/05/2011   09/09/10 TSH 1.35  09/09/10 B12 257  11/14/10 EEG - normal   ASSESSMENT AND PLAN  28 y.o. right-handed female with seizure disorder and migraine headaches.  Her seizure disorder (possibly primary generalized) is extremely well-controlled on Topamax.  Having intermittent memory difficulty since 2011; could be related to TPX.  PLAN: 1. Continue TPX 50mg  in am and 100mg  qhs for seizure and migraine prevention; may consider reducing TPX in the future 2. Will refer to [facility] for transfer of care   Suanne Marker, MD 12/26/2013, 3:17 PM Certified in Neurology, Neurophysiology and Neuroimaging  [facility] [address], [address] [place], [place] [address] [phone]

## 2013-12-26 NOTE — Patient Instructions (Signed)
Continue current medications. 

## 2014-01-10 ENCOUNTER — Telehealth: Payer: Self-pay | Admitting: *Deleted

## 2014-01-10 DIAGNOSIS — R569 Unspecified convulsions: Secondary | ICD-10-CM

## 2014-01-10 NOTE — Telephone Encounter (Signed)
Patient [place] [facility] [place] [facility]  Please advise / refer.

## 2014-01-16 NOTE — Addendum Note (Signed)
Addended byJoycelyn Schmid: PENUMALLI, VIKRAM on: 01/16/2014 03:20 PM   Modules accepted: Orders

## 2014-01-19 ENCOUNTER — Telehealth: Payer: Self-pay | Admitting: *Deleted

## 2014-01-19 NOTE — Telephone Encounter (Signed)
I called and LMVM on Dr. Callie FieldingLinthicum's pts VM re: asking Dr. Marjory LiesPenumalli if ok to take oral doxycycline re: her seizures history.   Per Dr. Marjory LiesPenumalli , ok to use doxycycline.  I LMVM for this and left # to call back if has questions.  I also faxed to [phone].

## 2014-06-27 ENCOUNTER — Ambulatory Visit: Payer: 59 | Admitting: Diagnostic Neuroimaging

## 2014-12-15 ENCOUNTER — Other Ambulatory Visit: Payer: Self-pay | Admitting: Diagnostic Neuroimaging

## 2014-12-17 NOTE — Telephone Encounter (Signed)
Patient has not been seen in 1 year.  She no showed last appt.  I called the patient, got no answer.  Mailbox was full, unable to leave message.

## 2014-12-17 NOTE — Telephone Encounter (Signed)
[facility]

## 2016-11-05 ENCOUNTER — Encounter: Payer: Self-pay | Admitting: Diagnostic Neuroimaging

## 2016-11-30 NOTE — L&D Delivery Note (Signed)
Delivery Note At 3:15 PM a viable female was delivered via Vaginal, Spontaneous (Presentation: LOA).  APGAR: 8, 9; weight pending.   Placenta status: S, I. 3V Cord with the following complications: None.  Cord pH: n/a  Anesthesia:  CLEA Episiotomy: None Lacerations: None Suture Repair: n/a Est. Blood Loss (mL): 250  Mom to postpartum.  Baby to Couplet care / Skin to Skin.  Channing Yeager 10/23/2017, 3:29 PM

## 2016-12-14 ENCOUNTER — Ambulatory Visit: Payer: Self-pay | Admitting: Diagnostic Neuroimaging

## 2016-12-15 ENCOUNTER — Ambulatory Visit: Payer: Self-pay | Admitting: Diagnostic Neuroimaging

## 2016-12-18 ENCOUNTER — Encounter: Payer: Self-pay | Admitting: Diagnostic Neuroimaging

## 2017-01-26 ENCOUNTER — Encounter: Payer: Self-pay | Admitting: *Deleted

## 2017-01-26 ENCOUNTER — Ambulatory Visit (HOSPITAL_COMMUNITY)
Admission: RE | Admit: 2017-01-26 | Discharge: 2017-01-26 | Disposition: A | Discharge status: Home / Self Care | Payer: 59 | Source: Ambulatory Visit | Attending: Obstetrics & Gynecology | Admitting: Obstetrics & Gynecology

## 2017-01-26 ENCOUNTER — Encounter (HOSPITAL_COMMUNITY): Payer: Self-pay

## 2017-01-27 ENCOUNTER — Ambulatory Visit (INDEPENDENT_AMBULATORY_CARE_PROVIDER_SITE_OTHER): Payer: 59 | Admitting: Diagnostic Neuroimaging

## 2017-01-27 ENCOUNTER — Encounter: Payer: Self-pay | Admitting: Diagnostic Neuroimaging

## 2017-01-27 VITALS — BP 118/77 | HR 67 | Ht 66.0 in | Wt 159.8 lb

## 2017-01-27 DIAGNOSIS — G40909 Epilepsy, unspecified, not intractable, without status epilepticus: Secondary | ICD-10-CM

## 2017-01-27 DIAGNOSIS — G43009 Migraine without aura, not intractable, without status migrainosus: Secondary | ICD-10-CM

## 2017-01-27 MED ORDER — PROMETHAZINE HCL 12.5 MG PO TABS: 12.5000 mg | ORAL_TABLET | Freq: Three times a day (TID) | ORAL | 0 refills | Status: DC | PRN

## 2017-01-27 MED ORDER — ELETRIPTAN HYDROBROMIDE 40 MG PO TABS: 40.0000 mg | ORAL_TABLET | ORAL | 12 refills | Status: DC | PRN

## 2017-01-27 MED ORDER — TROKENDI XR 50 MG PO CP24: 1.0000 | ORAL_CAPSULE | Freq: Every day | ORAL | 4 refills | Status: DC

## 2017-01-27 NOTE — Patient Instructions (Addendum)
Thank you for coming to see [place] at [facility]. I hope we have been able to provide you high quality care today.  You may receive a patient satisfaction survey over the next few weeks. We would appreciate your feedback and comments so that we may continue to improve ourselves and the health of our patients.   - trokendi '50mg'$  daily for migraine prevention  - relpax for migraine rescue  - phenergan 12.5 mg as needed for nausea/migraine  - may return for migraine infusion if needed   - monitor seizure disorder (last seizure ~2004)  ~~~~~~~~~~~~~~~~~~~~~~~~~~~~~~~~~~~~~~~~~~~~~~~~~~~~~~~~~~~~~~~~~  DR. PENUMALLI'S GUIDE TO HAPPY AND HEALTHY LIVING These are some of my general health and wellness recommendations. Some of them may apply to you better than others. Please use common sense as you try these suggestions and feel free to ask me any questions.   ACTIVITY/FITNESS Mental, social, emotional and physical stimulation are very important for brain and body health. Try learning a new activity (arts, music, language, sports, games).  Keep moving your body to the best of your abilities. You can do this at home, inside or outside, the park, community center, gym or anywhere you like. Consider a physical therapist or personal trainer to get started. Consider the app Sworkit. Fitness trackers such as smart-watches, smart-phones or Fitbits can help as well.   NUTRITION Eat more plants: colorful vegetables, nuts, seeds and berries.  Eat less sugar, salt, preservatives and processed foods.  Avoid toxins such as cigarettes and alcohol.  Drink water when you are thirsty. Warm water with a slice of lemon is an excellent morning drink to start the day.  Consider these websites for more information The Nutrition Source (https://www.henry-hernandez.biz/) Precision Nutrition (WindowBlog.ch)   RELAXATION Consider practicing  mindfulness meditation or other relaxation techniques such as deep breathing, prayer, yoga, tai chi, massage. See website mindful.org or the apps Headspace or Calm to help get started.   SLEEP Try to get at least 7-8+ hours sleep per day. Regular exercise and reduced caffeine will help you sleep better. Practice good sleep hygeine techniques. See website sleep.org for more information.   PLANNING Prepare estate planning, living will, healthcare POA documents. Sometimes this is best planned with the help of an attorney. Theconversationproject.org and agingwithdignity.org are excellent resources.

## 2017-01-27 NOTE — Progress Notes (Signed)
[facility]  PATIENT: Anna Rhodes DOB: 1986/10/10  REFERRING CLINICIAN:  HISTORY FROM: patient REASON FOR VISIT: follow up   HISTORICAL  CHIEF COMPLAINT:  Chief Complaint  Patient presents with  . Seizures    rm 7, "last sz > 20 yrs ago; last few days I've headache with nausea; Phenergan has worked in the past; on Trokendi x 2 yrs; Ibuprofen prn-not really helpful"  . Re-establish care  . Migraine    HISTORY OF PRESENT ILLNESS:   UPDATE 01/27/17: Since last visit patient has moved back to [place], and look to transfer care back here. Now on trokendi 50mg  daily. Having 2-3 days HA per month. No auras. Has been having HA for last 2 days with nausea. No seizure issues.   UPDATE 12/26/13: Since last visit, no seizures. Has been having more cognitive difficulty, and she thinks it is related to starting norvasc. She stopped it yesterday, and now her PCP has rx'd atenolol+chlorthalidone. She is concerned about medication side effects. She has relocated to [facility] and looking to transfer care.  UPDATE 06/27/13: Since last visit, doing well. No seizures. HA stable. On TPX 50 / 100. No side effects. Did go to ER recently for unexplained abdominal pain, but it has improved. On birth control for ovarian cyst. Asking about prenatal planning options.  UPDATE 11/16/12: Doing well without seizures.  Having intermittent sharp pains left side of head.  Photophobia, no nausea or vomiting.  Pain 7.5/10.    UPDATE 02/29/12: Doing well. No sz since 2004. Memory still poor subjectively.  UPDATE 11/12/10: No seizures.  Having worsening left sided sharp headaches; sometimes throbbing.  Now 8.5/10.  No nausea/vomiting.  Feels confused.  Spacing out.  Had episode at work, with headache, unable to speak, but could hear everything.  UPDATE 09/09/10:  31 year old female.  No seizures since last visit. She reports last seizure was 7 years ago (2004).  Over the past 2 months she reports  increasing episodes of memory difficulty, confusion and headaches.  She denies any significant change in her diet, physical activity, home or work situation. She denies depression or anxiety. She was started on lisinopril 20 mg per day 2 months ago for hypertension.  PRIOR HPI: 31 year old female with history of seizure disorder and migraine headaches presenting to establish care with local neurologist. Patient's previous evaluated in [place] at the [facility] (Dr. Excell Seltzer), then Dr. Laurelyn Sickle in [place], and now has moved back to [place].  Her seizure disorder dates back to the age of 31 years old. She had a grand mal seizure with no aura. She was initially treated with Tegretol and Keppra and Lamictal. She was well controlled on Lamictal for a long time until she developed dark spots on her skin. Approximately one year ago she was switched to Topamax 100 mg daily.  Her last seizure was 6 years ago. She has no side effects from Topamax.  She was previously diagnosed with migraine headaches (throbbing pain, phonophobia; denies nausea, vomiting, visual changes or photophobia).  She used to take ibuprofen 600 mg p.r.n. with good relief.  Her headaches were controlled for many years. Over the last several weeks she's had recurrence of milder headaches. She is complaining of difficulty sleeping (able to fall asleep but wakes up in the middle of the night).  She denies snoring or bleeding trouble. She denies anxiety or depression.   REVIEW OF SYSTEMS: Full 14 system review of systems performed and negative except:  headache and confusion.   ALLERGIES: Allergies  Allergen Reactions  . Bactrim     hives  . Sulfamethoxazole-Trimethoprim     REACTION: hives    HOME MEDICATIONS: Outpatient Medications Prior to Visit  Medication Sig Dispense Refill  . atenolol-chlorthalidone (TENORETIC) 50-25 MG per tablet Take 1 tablet by mouth daily.    Marland Kitchen FLUoxetine (PROZAC) 40 MG  capsule Take 1 capsule (40 mg total) by mouth daily. 30 capsule 5  . mometasone (ELOCON) 0.1 % lotion APPLY TO AFFECTED AREA DAILY AS DIRECTED 60 mL 4  . topiramate (TOPAMAX) 50 MG tablet take 50mg  in am and 100mg  qhs 90 tablet 6   No facility-administered medications prior to visit.     PAST MEDICAL HISTORY: Past Medical History:  Diagnosis Date  . Diabetes mellitus without complication (HCC)   . Headache(784.0) 02/28/2010   migraines  . HYPERTENSION 02/28/2010  . Seizures (HCC)     PAST SURGICAL HISTORY: History reviewed. No pertinent surgical history.  FAMILY HISTORY: Family History  Problem Relation Age of Onset  . Stroke Mother   . Hypertension Mother   . Mental illness Mother   . Diabetes Mother   . Kidney failure Mother   . Arthritis Mother   . Hypertension Father   . Mental illness Father   . Diabetes Father   . Stroke Father   . Hypertension Sister   . Hypercholesterolemia Sister   . Alcohol abuse Brother   . Mental illness Brother   . Sudden death Maternal Grandfather 5  . Diabetes Maternal Grandfather   . Diabetes Maternal Grandmother   . Diabetes Paternal Grandmother     SOCIAL HISTORY:  Social History   Social History  . Marital status: Single    Spouse name: N/A  . Number of children: 0  . Years of education: MA   Occupational History  . Counselor     [employer]   Social History Main Topics  . Smoking status: Never Smoker  . Smokeless tobacco: Never Used  . Alcohol use No  . Drug use: No  . Sexual activity: Not on file   Other Topics Concern  . Not on file   Social History Narrative   Patient lives at home alone.   Caffeine Use: 1 cup every other day     PHYSICAL EXAM  Vitals:   01/27/17 0925  BP: 118/77  Pulse: 67  Weight: 159 lb 12.8 oz (72.5 kg)  Height: 5\' 6"  (1.676 m)    Not recorded      Body mass index is 25.79 kg/m.  GENERAL EXAM/CONSTITUTIONAL: Vitals:  Vitals:   01/27/17 0925  BP: 118/77  Pulse: 67   Weight: 159 lb 12.8 oz (72.5 kg)  Height: 5\' 6"  (1.676 m)     Patient is in no distress; well developed, nourished and groomed; neck is supple  CARDIOVASCULAR:  Examination of carotid arteries is normal; no carotid bruits  Regular rate and rhythm, no murmurs  Examination of peripheral vascular system by observation and palpation is normal  EYES:  Ophthalmoscopic exam of optic discs and posterior segments is normal; no papilledema or hemorrhages  MUSCULOSKELETAL:  Gait, strength, tone, movements noted in Neurologic exam below  NEUROLOGIC: MENTAL STATUS:   awake, alert, oriented to person, place and time  recent and remote memory intact  normal attention and concentration  language fluent, comprehension intact, naming intact,   fund of knowledge appropriate  CRANIAL NERVE:   2nd - no papilledema on fundoscopic  exam  2nd, 3rd, 4th, 6th - pupils equal and reactive to light, visual fields full to confrontation, extraocular muscles intact, no nystagmus  5th - facial sensation symmetric  7th - facial strength symmetric  8th - hearing intact  9th - palate elevates symmetrically, uvula midline  11th - shoulder shrug symmetric  12th - tongue protrusion midline  MOTOR:   normal bulk and tone, full strength in the BUE, BLE  SENSORY:   normal and symmetric to light touch, temperature, vibration  COORDINATION:   finger-nose-finger, fine finger movements normal  REFLEXES:   deep tendon reflexes present and symmetric  GAIT/STATION:   narrow based gait    DIAGNOSTIC DATA (LABS, IMAGING, TESTING) - I reviewed patient records, labs, notes, testing and imaging myself where available.  Lab Results  Component Value Date   WBC 9.9 02/15/2013   HGB 13.4 02/15/2013   HCT 41.3 02/15/2013   MCV 88.8 02/15/2013   PLT 330.0 02/15/2013      Component Value Date/Time   NA 137 02/15/2013 1457   K 4.1 02/15/2013 1457   CL 105 02/15/2013 1457   CO2 27  02/15/2013 1457   GLUCOSE 100 (H) 02/15/2013 1457   BUN 7 02/15/2013 1457   CREATININE 0.6 02/15/2013 1457   CALCIUM 9.0 02/15/2013 1457   PROT 7.7 02/15/2013 1457   ALBUMIN 3.8 02/15/2013 1457   AST 19 02/15/2013 1457   ALT 20 02/15/2013 1457   ALKPHOS 74 02/15/2013 1457   BILITOT 0.4 02/15/2013 1457   GFRNONAA 119.44 06/27/2010 1139   No results found for: CHOL No results found for: HGBA1C No results found for: VITAMINB12 Lab Results  Component Value Date   TSH 1.67 05/05/2011   09/09/10 TSH 1.35  09/09/10 B12 257  11/14/10 EEG - normal  08/09/14 MRI brain [facility]) - normal      ASSESSMENT AND PLAN  31 y.o. right-handed female with seizure disorder and migraine headaches.  Her seizure disorder (possibly primary generalized) is extremely well-controlled. HA are ~2-3 per month.   Dx:  Migraine without aura and without status migrainosus, not intractable  Seizure disorder (HCC)    PLAN:  - trokendi 50mg  daily for migraine control  - relpax for migraine treatment  - phenergan 12.5 mg as needed for nausea/migraine  - may return for migraine infusion if needed (depacon, compazine, toradol)  - monitor seizure disorder (last seizure ~2004)  - discussed prenatal planning options; if she were to consider conception, she should start prenatal vitamins and folic acid under supervision of ob/gyn. We could consider lowering or tapering off TPX in preparation. Highest risk of TPX for the fetus is in the 1st trimester, as with most medications. She is not actively planning pregnancy at this time, but just getting information for the future.   Meds ordered this encounter  Medications  . TROKENDI XR 50 MG CP24    Sig: Take 1 capsule by mouth daily.    Dispense:  90 capsule    Refill:  4  . eletriptan (RELPAX) 40 MG tablet    Sig: Take 1 tablet (40 mg total) by mouth as needed for migraine. May repeat in 2 hours if needed. Max 2 tabs per day or 8 tabs per  month.    Dispense:  10 tablet    Refill:  12  . promethazine (PHENERGAN) 12.5 MG tablet    Sig: Take 1 tablet (12.5 mg total) by mouth every 8 (eight) hours as needed for nausea or vomiting.  Dispense:  30 tablet    Refill:  0   Return in about 6 months (around 07/27/2017).    Suanne MarkerVIKRAM R. Randilyn Foisy, MD 01/27/2017, 10:15 AM Certified in Neurology, Neurophysiology and Neuroimaging  [facility] [address], [address] [place], [place] [id number] [phone]

## 2017-03-01 ENCOUNTER — Telehealth: Payer: Self-pay | Admitting: Diagnostic Neuroimaging

## 2017-03-01 NOTE — Telephone Encounter (Signed)
She can stop Trokendi.    We can check EEG while she is off treatment in about a week.    If seizure like activity we may want to consider a different agent but keep her off Trokendi and other agents if it is normal

## 2017-03-01 NOTE — Telephone Encounter (Signed)
Spoke to pt and relayed the message pre Dr. Epimenio Foot.  Her last dose of trokendi was this am.  She will not take anymore.  Recommend EEG off medication in about one week.  If sz activity will consider another medication but keep off trokendi and other agents if normal.  This was from our [facility], but will send to Dr Marjory Lies as well.  If any changes will contact her again.  I did not place EEG order yet.

## 2017-03-01 NOTE — Telephone Encounter (Signed)
Pt just found out that she is pregnant and her OBGYN advised that she no longer takes her TROKENDI XR 50 MG CP24, was told to call our office about this by her OBGYN  Pt said that she took it this morning but will not after today. Pt said she has not had a seziure in over 15 years, would like a call back on this matter

## 2017-03-01 NOTE — Telephone Encounter (Signed)
Spoke to pt and she relayed she is pregnant now (has first [place] 03-16-17) will be 7 wks she thinks.  Per Dr. Richrd Humbles last note, pt was thinking about this, she just found out.   Taking trokendi  po daily (taking last dose this am).  She has not had sz since 2004.  Please advise.  [facility] [address]

## 2017-03-03 NOTE — Telephone Encounter (Signed)
Ok to stay off trokendi. No need for eeg right now. May come in for office visit in next 2-4 weeks.

## 2017-03-04 NOTE — Telephone Encounter (Signed)
LMVM for pt to call me re: setting up appt.  (looking at 03-16-17)  I know she has [place] that day as well.  Will hold for appt, if ok. 03-16-17 at 0800.

## 2017-03-04 NOTE — Telephone Encounter (Signed)
Pt returned Rn's call, said 4/17 8am appt will not work, [place] at 8:30. Please call

## 2017-03-04 NOTE — Telephone Encounter (Signed)
Spoke with pt.  Offered 03-16-17 1000 that did not work, she asked about tomorrow,  Worked in at 1000, be here 0945.  She verbalized understanding.

## 2017-03-04 NOTE — Telephone Encounter (Signed)
LMVM for pt that got message 0800 not work.  Other appt option that day is 1000, not sure if will work.  Relayed that Dr Marjory Lies ok'd to stay off trokendi, no need for EEG right now, see in office appt 2-4 wks, I'm  trying to work that out.  If calls back and I'm not available hopefully will give time good for me to call her back.

## 2017-03-05 ENCOUNTER — Ambulatory Visit (INDEPENDENT_AMBULATORY_CARE_PROVIDER_SITE_OTHER): Payer: 59 | Admitting: Diagnostic Neuroimaging

## 2017-03-05 ENCOUNTER — Encounter: Payer: Self-pay | Admitting: Diagnostic Neuroimaging

## 2017-03-05 VITALS — BP 112/82 | HR 74 | Ht 66.0 in | Wt 163.4 lb

## 2017-03-05 DIAGNOSIS — O99351 Diseases of the nervous system complicating pregnancy, first trimester: Secondary | ICD-10-CM

## 2017-03-05 DIAGNOSIS — G40909 Epilepsy, unspecified, not intractable, without status epilepticus: Secondary | ICD-10-CM | POA: Diagnosis not present

## 2017-03-05 DIAGNOSIS — G43009 Migraine without aura, not intractable, without status migrainosus: Secondary | ICD-10-CM | POA: Diagnosis not present

## 2017-03-05 DIAGNOSIS — Z3490 Encounter for supervision of normal pregnancy, unspecified, unspecified trimester: Secondary | ICD-10-CM

## 2017-03-05 HISTORY — DX: Encounter for supervision of normal pregnancy, unspecified, unspecified trimester: Z34.90

## 2017-03-05 NOTE — Patient Instructions (Signed)
-   stay off trokendi; may consider restarting in 2nd or 3rd trimester if needed  - stay off relpax for now; use tylenol as needed for headaches  - follow up with ob/gyn  - start folic acid  daily at least for 1st trimester; then may reduce to 1-2mg  daily or per ob/gyn recommendation  - continue prenatal vitamin supplement  - continue to monitor seizure disorder (last seizure ~2004)

## 2017-03-05 NOTE — Progress Notes (Signed)
[facility]  PATIENT: Anna Rhodes DOB: 11-Jul-1986  REFERRING CLINICIAN:  HISTORY FROM: patient REASON FOR VISIT: follow up   HISTORICAL  CHIEF COMPLAINT:  Chief Complaint  Patient presents with  . Follow-up  . Seizures    none, stopped trokendi this tuesday 03-02-17  . Migraines    since stopped has had  mild headaches in the evening.  (not every day).      HISTORY OF PRESENT ILLNESS:   UPDATE 03/05/17: Since last visit, now found out that she is probably [redacted] weeks pregnant (based on home pregnancy test). Now off trokendi x 1 week. No headaches. No seizures since 2004. Now on prenatal vitamin. Planning to see ob/gyn for 1st visit (April 17, Dr. Langston Masker).   UPDATE 01/27/17: Since last visit patient has moved back to [place], and look to transfer care back here. Now on trokendi  daily. Having 2-3 days HA per month. No auras. Has been having HA for last 2 days with nausea. No seizure issues.   UPDATE 12/26/13: Since last visit, no seizures. Has been having more cognitive difficulty, and she thinks it is related to starting norvasc. She stopped it yesterday, and now her PCP has rx'd atenolol+chlorthalidone. She is concerned about medication side effects. She has relocated to [facility] and looking to transfer care.  UPDATE 06/27/13: Since last visit, doing well. No seizures. HA stable. On TPX 50 / 100. No side effects. Did go to ER recently for unexplained abdominal pain, but it has improved. On birth control for ovarian cyst. Asking about prenatal planning options.  UPDATE 11/16/12: Doing well without seizures.  Having intermittent sharp pains left side of head.  Photophobia, no nausea or vomiting.  Pain 7.5/10.    UPDATE 02/29/12: Doing well. No sz since 2004. Memory still poor subjectively.  UPDATE 11/12/10: No seizures.  Having worsening left sided sharp headaches; sometimes throbbing.  Now 8.5/10.  No nausea/vomiting.  Feels confused.  Spacing out.  Had episode  at work, with headache, unable to speak, but could hear everything.  UPDATE 09/09/10:  31 year old female.  No seizures since last visit. She reports last seizure was 7 years ago (2004).  Over the past 2 months she reports increasing episodes of memory difficulty, confusion and headaches.  She denies any significant change in her diet, physical activity, home or work situation. She denies depression or anxiety. She was started on lisinopril 20 mg per day 2 months ago for hypertension.  PRIOR HPI: 31 year old female with history of seizure disorder and migraine headaches presenting to establish care with local neurologist. Patient's previous evaluated in [place] at the [facility] (Dr. Excell Seltzer), then Dr. Laurelyn Sickle in [place], and now has moved back to [place].  Her seizure disorder dates back to the age of 31 years old. She had a grand mal seizure with no aura. She was initially treated with Tegretol and Keppra and Lamictal. She was well controlled on Lamictal for a long time until she developed dark spots on her skin. Approximately one year ago she was switched to Topamax 100 mg daily.  Her last seizure was 6 years ago. She has no side effects from Topamax.  She was previously diagnosed with migraine headaches (throbbing pain, phonophobia; denies nausea, vomiting, visual changes or photophobia).  She used to take ibuprofen 600 mg p.r.n. with good relief.  Her headaches were controlled for many years. Over the last several weeks she's had recurrence of milder headaches. She is complaining  of difficulty sleeping (able to fall asleep but wakes up in the middle of the night).  She denies snoring or bleeding trouble. She denies anxiety or depression.   REVIEW OF SYSTEMS: Full 14 system review of systems performed and negative except: pregnancy.    ALLERGIES: Allergies  Allergen Reactions  . Bactrim     hives  . Sulfamethoxazole-Trimethoprim     REACTION: hives     HOME MEDICATIONS: Outpatient Medications Prior to Visit  Medication Sig Dispense Refill  . atenolol-chlorthalidone (TENORETIC) 50-25 MG per tablet Take 1 tablet by mouth daily.    Marland Kitchen eletriptan (RELPAX) 40 MG tablet Take 1 tablet (40 mg total) by mouth as needed for migraine. May repeat in 2 hours if needed. Max 2 tabs per day or 8 tabs per month. 10 tablet 12  . ELIDEL 1 % cream     . metFORMIN (GLUCOPHAGE) 1000 MG tablet 1,000 mg daily.    . promethazine (PHENERGAN) 12.5 MG tablet Take 1 tablet (12.5 mg total) by mouth every 8 (eight) hours as needed for nausea or vomiting. 30 tablet 0  . medroxyPROGESTERone (PROVERA) 10 MG tablet 10 mg daily. 01/27/17 has not started, takes daily x 10 days a month    . TROKENDI XR 50 MG CP24 Take 1 capsule by mouth daily. (Patient not taking: Reported on 03/05/2017) 90 capsule 4   No facility-administered medications prior to visit.     PAST MEDICAL HISTORY: Past Medical History:  Diagnosis Date  . Diabetes mellitus without complication (HCC)   . Headache(784.0) 02/28/2010   migraines  . HYPERTENSION 02/28/2010  . Pregnancy 03/05/2017   about 7 wks now  . Seizures (HCC)     PAST SURGICAL HISTORY: No past surgical history on file.  FAMILY HISTORY: Family History  Problem Relation Age of Onset  . Stroke Mother   . Hypertension Mother   . Mental illness Mother   . Diabetes Mother   . Kidney failure Mother   . Arthritis Mother   . Hypertension Father   . Mental illness Father   . Diabetes Father   . Stroke Father   . Hypertension Sister   . Hypercholesterolemia Sister   . Alcohol abuse Brother   . Mental illness Brother   . Sudden death Maternal Grandfather 51  . Diabetes Maternal Grandfather   . Diabetes Maternal Grandmother   . Diabetes Paternal Grandmother     SOCIAL HISTORY:  Social History   Social History  . Marital status: Single    Spouse name: N/A  . Number of children: 0  . Years of education: MA   Occupational  History  . Counselor     [employer]   Social History Main Topics  . Smoking status: Never Smoker  . Smokeless tobacco: Never Used  . Alcohol use No  . Drug use: No  . Sexual activity: Not on file   Other Topics Concern  . Not on file   Social History Narrative   Patient lives at home alone.   Caffeine Use: 1 cup every other day     PHYSICAL EXAM  Vitals:   03/05/17 1005  BP: 112/82  Pulse: 74  Weight: 163 lb 6.4 oz (74.1 kg)  Height:  (1.676 m)    Not recorded      Body mass index is 26.37 kg/m.  GENERAL EXAM/CONSTITUTIONAL: Vitals:  Vitals:   03/05/17 1005  BP: 112/82  Pulse: 74  Weight: 163 lb 6.4 oz (74.1 kg)  Height:  (1.676 m)    Patient is in no distress; well developed, nourished and groomed; neck is supple  CARDIOVASCULAR:  Examination of carotid arteries is normal; no carotid bruits  Regular rate and rhythm, no murmurs  Examination of peripheral vascular system by observation and palpation is normal  EYES:  Ophthalmoscopic exam of optic discs and posterior segments is normal; no papilledema or hemorrhages  MUSCULOSKELETAL:  Gait, strength, tone, movements noted in Neurologic exam below  NEUROLOGIC: MENTAL STATUS:   awake, alert, oriented to person, place and time  recent and remote memory intact  normal attention and concentration  language fluent, comprehension intact, naming intact,   fund of knowledge appropriate  CRANIAL NERVE:   2nd - no papilledema on fundoscopic exam  2nd, 3rd, 4th, 6th - pupils equal and reactive to light, visual fields full to confrontation, extraocular muscles intact, no nystagmus  5th - facial sensation symmetric  7th - facial strength symmetric  8th - hearing intact  9th - palate elevates symmetrically, uvula midline  11th - shoulder shrug symmetric  12th - tongue protrusion midline  MOTOR:   normal bulk and tone, full strength in the BUE, BLE  SENSORY:   normal  and symmetric to light touch, temperature, vibration  COORDINATION:   finger-nose-finger, fine finger movements normal  REFLEXES:   deep tendon reflexes present and symmetric  GAIT/STATION:   narrow based gait    DIAGNOSTIC DATA (LABS, IMAGING, TESTING) - I reviewed patient records, labs, notes, testing and imaging myself where available.  Lab Results  Component Value Date   WBC 9.9 02/15/2013   HGB 13.4 02/15/2013   HCT 41.3 02/15/2013   MCV 88.8 02/15/2013   PLT 330.0 02/15/2013      Component Value Date/Time   NA 137 02/15/2013 1457   K 4.1 02/15/2013 1457   CL 105 02/15/2013 1457   CO2 27 02/15/2013 1457   GLUCOSE 100 (H) 02/15/2013 1457   BUN 7 02/15/2013 1457   CREATININE 0.6 02/15/2013 1457   CALCIUM 9.0 02/15/2013 1457   PROT 7.7 02/15/2013 1457   ALBUMIN 3.8 02/15/2013 1457   AST 19 02/15/2013 1457   ALT 20 02/15/2013 1457   ALKPHOS 74 02/15/2013 1457   BILITOT 0.4 02/15/2013 1457   GFRNONAA 119.44 06/27/2010 1139   No results found for: CHOL No results found for: HGBA1C No results found for: VITAMINB12 Lab Results  Component Value Date   TSH 1.67 05/05/2011   09/09/10 TSH 1.35  09/09/10 B12 257  11/14/10 EEG - normal  08/09/14 MRI brain [facility]) - normal     ASSESSMENT AND PLAN  31 y.o. right-handed female with seizure disorder and migraine headaches.  Her seizure disorder (possibly primary generalized) is extremely well-controlled. HA are ~2-3 per month.  Now pregnant (5 weeks) and off trokendi.    Dx:  Seizure disorder in pregnancy, first trimester (HCC)  Migraine without aura and without status migrainosus, not intractable    PLAN:  - stay off trokendi; may consider restarting in 2nd or 3rd trimester if needed  - stay off relpax for now; use tylenol as needed for headaches  - follow up with ob/gyn  - start folic acid  daily at least for 1st trimester; then may reduce to 1-2mg  daily  - continue prenatal  vitamin supplement  - continue to monitor seizure disorder (last seizure ~2004)  Return in about 3 months (around 06/04/2017).  I reviewed images, labs, notes, records myself. I summarized findings  and reviewed with patient, for this high risk condition (seizure disorder in pregnancy) requiring high complexity decision making.     Suanne Marker, MD 03/05/2017, 10:32 AM Certified in Neurology, Neurophysiology and Neuroimaging  [facility] [address], [address] [place], [place] [address] [phone]

## 2017-03-25 LAB — OB RESULTS CONSOLE GC/CHLAMYDIA
CHLAMYDIA, DNA PROBE: NEGATIVE
Gonorrhea: NEGATIVE

## 2017-03-25 LAB — OB RESULTS CONSOLE ABO/RH: RH Type: POSITIVE

## 2017-03-25 LAB — OB RESULTS CONSOLE ANTIBODY SCREEN: Antibody Screen: NEGATIVE

## 2017-03-25 LAB — OB RESULTS CONSOLE HEPATITIS B SURFACE ANTIGEN: Hepatitis B Surface Ag: NEGATIVE

## 2017-03-25 LAB — OB RESULTS CONSOLE HIV ANTIBODY (ROUTINE TESTING): HIV: NONREACTIVE

## 2017-03-25 LAB — OB RESULTS CONSOLE RPR: RPR: NONREACTIVE

## 2017-03-25 LAB — OB RESULTS CONSOLE RUBELLA ANTIBODY, IGM: Rubella: IMMUNE

## 2017-05-24 ENCOUNTER — Encounter (HOSPITAL_COMMUNITY): Payer: Self-pay | Admitting: *Deleted

## 2017-05-24 ENCOUNTER — Inpatient Hospital Stay (HOSPITAL_COMMUNITY)
Admission: AD | Admit: 2017-05-24 | Discharge: 2017-05-24 | Disposition: A | Discharge status: Home / Self Care | Payer: 59 | Source: Ambulatory Visit | Attending: Obstetrics and Gynecology | Admitting: Obstetrics and Gynecology

## 2017-05-24 DIAGNOSIS — O10912 Unspecified pre-existing hypertension complicating pregnancy, second trimester: Secondary | ICD-10-CM | POA: Insufficient documentation

## 2017-05-24 DIAGNOSIS — O219 Vomiting of pregnancy, unspecified: Secondary | ICD-10-CM

## 2017-05-24 DIAGNOSIS — R748 Abnormal levels of other serum enzymes: Secondary | ICD-10-CM | POA: Diagnosis not present

## 2017-05-24 DIAGNOSIS — K219 Gastro-esophageal reflux disease without esophagitis: Secondary | ICD-10-CM | POA: Diagnosis not present

## 2017-05-24 DIAGNOSIS — R079 Chest pain, unspecified: Secondary | ICD-10-CM

## 2017-05-24 DIAGNOSIS — O26892 Other specified pregnancy related conditions, second trimester: Secondary | ICD-10-CM | POA: Diagnosis present

## 2017-05-24 DIAGNOSIS — Z3A16 16 weeks gestation of pregnancy: Secondary | ICD-10-CM | POA: Diagnosis not present

## 2017-05-24 LAB — COMPREHENSIVE METABOLIC PANEL
ALBUMIN: 3.1 g/dL — AB (ref 3.5–5.0)
ALK PHOS: 59 U/L (ref 38–126)
ALT: 105 U/L — AB (ref 14–54)
AST: 109 U/L — AB (ref 15–41)
Anion gap: 6 (ref 5–15)
BUN: 6 mg/dL (ref 6–20)
CALCIUM: 9.6 mg/dL (ref 8.9–10.3)
CHLORIDE: 103 mmol/L (ref 101–111)
CO2: 24 mmol/L (ref 22–32)
Creatinine, Ser: 0.41 mg/dL — ABNORMAL LOW (ref 0.44–1.00)
GFR calc Af Amer: >60 mL/min (ref >60)
GFR calc non Af Amer: >60 mL/min (ref >60)
GLUCOSE: 95 mg/dL (ref 65–99)
Potassium: 3.9 mmol/L (ref 3.5–5.1)
SODIUM: 133 mmol/L — AB (ref 135–145)
Total Bilirubin: 0.6 mg/dL (ref 0.3–1.2)
Total Protein: 6.5 g/dL (ref 6.5–8.1)

## 2017-05-24 LAB — CBC
HCT: 39.1 % (ref 36.0–46.0)
HEMOGLOBIN: 13.5 g/dL (ref 12.0–15.0)
MCH: 31.2 pg (ref 26.0–34.0)
MCHC: 34.5 g/dL (ref 30.0–36.0)
MCV: 90.3 fL (ref 78.0–100.0)
Platelets: 278 10*3/uL (ref 150–400)
RBC: 4.33 MIL/uL (ref 3.87–5.11)
RDW: 13 % (ref 11.5–15.5)
WBC: 13.5 10*3/uL — AB (ref 4.0–10.5)

## 2017-05-24 LAB — URINALYSIS, ROUTINE W REFLEX MICROSCOPIC
Bilirubin Urine: NEGATIVE
Glucose, UA: NEGATIVE mg/dL
Hgb urine dipstick: NEGATIVE
Ketones, ur: NEGATIVE mg/dL
LEUKOCYTES UA: NEGATIVE
NITRITE: NEGATIVE
PROTEIN: NEGATIVE mg/dL
Specific Gravity, Urine: 1.003 — ABNORMAL LOW (ref 1.005–1.030)
pH: 8 (ref 5.0–8.0)

## 2017-05-24 LAB — PROTEIN / CREATININE RATIO, URINE
Creatinine, Urine: 19 mg/dL
Total Protein, Urine: <6 mg/dL

## 2017-05-24 LAB — TROPONIN I: Troponin I: <0.03 ng/mL

## 2017-05-24 MED ORDER — ONDANSETRON 4 MG PO TBDP
4.0000 mg | ORAL_TABLET | Freq: Once | ORAL | Status: DC
Start: 2017-05-24 — End: 2017-05-24

## 2017-05-24 MED ORDER — RANITIDINE HCL 150 MG PO TABS: 150.0000 mg | ORAL_TABLET | Freq: Two times a day (BID) | ORAL | 1 refills | Status: DC

## 2017-05-24 MED ORDER — PROMETHAZINE HCL 25 MG/ML IJ SOLN
25.0000 mg | Freq: Once | INTRAMUSCULAR | Status: AC
  Administered 2017-05-24: 25 mg via INTRAMUSCULAR
  Filled 2017-05-24: qty 1

## 2017-05-24 MED ORDER — PROMETHAZINE HCL 25 MG PO TABS
25.0000 mg | ORAL_TABLET | Freq: Once | ORAL | Status: AC
Start: 2017-05-24 — End: 2017-05-24
  Administered 2017-05-24: 25 mg via ORAL
  Filled 2017-05-24: qty 1

## 2017-05-24 MED ORDER — GI COCKTAIL ~~LOC~~
30.0000 mL | Freq: Once | ORAL | Status: AC
  Administered 2017-05-24: 30 mL via ORAL
  Filled 2017-05-24: qty 30

## 2017-05-24 MED ORDER — PROMETHAZINE HCL 12.5 MG PO TABS: 12.5000 mg | ORAL_TABLET | Freq: Four times a day (QID) | ORAL | 0 refills | Status: DC | PRN

## 2017-05-24 NOTE — MAU Note (Signed)
Pt presents to MAU with complaints of chest pain, nausea and shortness of breath that started this morning while she was at work. Pt states she was started on labetalol on Thursday for her blood pressure.

## 2017-05-24 NOTE — MAU Provider Note (Signed)
History     CSN: [id number]  Arrival date and time: 05/24/17 1043   First Provider Initiated Contact with Patient 05/24/17 1148      Chief Complaint  Patient presents with  . Shortness of Breath  . Nausea  . Chest Pain   HPI   [name] is a 31 y.o. female G1P0 @ [redacted]w[redacted]d here in MAU with Chest pain and nausea. The symptoms started this morning. She was sitting at her desk at work and started sweating. She sat there for a moment and she then went and sat in the car. She took 2 tums because she thought it was heart burn. The pain continued and she started feeling it in her back area. She has never had this pain before. If she breaths deeply she feels the pain in her chest and in her back. No history of anxiety, she does have a history of chronic HTN and was started on labetalol on Thursday.   Symptoms occurred around 0900 this morning. She took her medication around 0730 (Metformin and Labetalol) She is taking labetalol 200 mg BID.  She checked her BS at 0800 and it was 98  For breakfast this morning she ate two bowls of oatmeal and fruit and a cup of water. Last night for dinner she had baked chicken, rice and stringed beans.   OB History    Gravida Para Term Preterm AB Living   1             SAB TAB Ectopic Multiple Live Births                  Past Medical History:  Diagnosis Date  . Diabetes mellitus without complication (HCC)   . Headache(784.0) 02/28/2010   migraines  . HYPERTENSION 02/28/2010  . Pregnancy 03/05/2017   about 7 wks now  . Seizures (HCC)     History reviewed. No pertinent surgical history.  Family History  Problem Relation Age of Onset  . Stroke Mother   . Hypertension Mother   . Mental illness Mother   . Diabetes Mother   . Kidney failure Mother   . Arthritis Mother   . Hypertension Father   . Mental illness Father   . Diabetes Father   . Stroke Father   . Hypertension Sister   . Hypercholesterolemia Sister   . Alcohol abuse Brother   .  Mental illness Brother   . Sudden death Maternal Grandfather 62  . Diabetes Maternal Grandfather   . Diabetes Maternal Grandmother   . Diabetes Paternal Grandmother     Social History  Substance Use Topics  . Smoking status: Never Smoker  . Smokeless tobacco: Never Used  . Alcohol use No    Allergies:  Allergies  Allergen Reactions  . Bactrim     hives  . Sulfamethoxazole-Trimethoprim     REACTION: hives    Prescriptions Prior to Admission  Medication Sig Dispense Refill Last Dose  . metFORMIN (GLUCOPHAGE) 1000 MG tablet 1,000 mg daily.   05/24/2017 at Unknown time  . atenolol-chlorthalidone (TENORETIC) 50-25 MG per tablet Take 1 tablet by mouth daily.   Taking  . eletriptan (RELPAX) 40 MG tablet Take 1 tablet (40 mg total) by mouth as needed for migraine. May repeat in 2 hours if needed. Max 2 tabs per day or 8 tabs per month. 10 tablet 12 Taking  . ELIDEL 1 % cream    Taking  . medroxyPROGESTERone (PROVERA) 10 MG tablet 10 mg daily. 01/27/17  has not started, takes daily x 10 days a month   Not Taking  . promethazine (PHENERGAN) 12.5 MG tablet Take 1 tablet (12.5 mg total) by mouth every 8 (eight) hours as needed for nausea or vomiting. 30 tablet 0 Taking  . TROKENDI XR 50 MG CP24 Take 1 capsule by mouth daily. (Patient not taking: Reported on 03/05/2017) 90 capsule 4 Not Taking   Results for orders placed or performed during the hospital encounter of 05/24/17 (from the past 48 hour(s))  Urinalysis, Routine w reflex microscopic     Status: Abnormal   Collection Time: 05/24/17 11:00 AM  Result Value Ref Range   Color, Urine YELLOW YELLOW   APPearance CLEAR CLEAR   Specific Gravity, Urine 1.003 (L) 1.005 - 1.030   pH 8.0 5.0 - 8.0   Glucose, UA NEGATIVE NEGATIVE mg/dL   Hgb urine dipstick NEGATIVE NEGATIVE   Bilirubin Urine NEGATIVE NEGATIVE   Ketones, ur NEGATIVE NEGATIVE mg/dL   Protein, ur NEGATIVE NEGATIVE mg/dL   Nitrite NEGATIVE NEGATIVE   Leukocytes, UA NEGATIVE  NEGATIVE  Protein / creatinine ratio, urine     Status: None   Collection Time: 05/24/17 11:00 AM  Result Value Ref Range   Creatinine, Urine 19.00 mg/dL   Total Protein, Urine <6 mg/dL    Comment: NO NORMAL RANGE ESTABLISHED FOR THIS TEST REPEATED TO VERIFY    Protein Creatinine Ratio        0.00 - 0.15 mg/mg[Cre]    Comment: RESULT BELOW REPORTABLE RANGE, UNABLE TO CALCULATE.   CBC     Status: Abnormal   Collection Time: 05/24/17 11:17 AM  Result Value Ref Range   WBC 13.5 (H) 4.0 - 10.5 K/uL   RBC 4.33 3.87 - 5.11 MIL/uL   Hemoglobin 13.5 12.0 - 15.0 g/dL   HCT 39.1 36.0 - 46.0 %   MCV 90.3 78.0 - 100.0 fL   MCH 31.2 26.0 - 34.0 pg   MCHC 34.5 30.0 - 36.0 g/dL   RDW 13.0 11.5 - 15.5 %   Platelets 278 150 - 400 K/uL  Comprehensive metabolic panel     Status: Abnormal   Collection Time: 05/24/17 11:17 AM  Result Value Ref Range   Sodium 133 (L) 135 - 145 mmol/L   Potassium 3.9 3.5 - 5.1 mmol/L   Chloride 103 101 - 111 mmol/L   CO2 24 22 - 32 mmol/L   Glucose, Bld 95 65 - 99 mg/dL   BUN 6 6 - 20 mg/dL   Creatinine, Ser 0.41 (L) 0.44 - 1.00 mg/dL   Calcium 9.6 8.9 - 10.3 mg/dL   Total Protein 6.5 6.5 - 8.1 g/dL   Albumin 3.1 (L) 3.5 - 5.0 g/dL   AST 109 (H) 15 - 41 U/L   ALT 105 (H) 14 - 54 U/L   Alkaline Phosphatase 59 38 - 126 U/L   Total Bilirubin 0.6 0.3 - 1.2 mg/dL   GFR calc non Af Amer >60 >60 mL/min   GFR calc Af Amer >60 >60 mL/min    Comment: (NOTE) The eGFR has been calculated using the CKD EPI equation. This calculation has not been validated in all clinical situations. eGFR's persistently <60 mL/min signify possible Chronic Kidney Disease.    Anion gap 6 5 - 15  Troponin I     Status: None   Collection Time: 05/24/17 11:17 AM  Result Value Ref Range   Troponin I <0.03 <0.03 ng/mL   Review of Systems  Cardiovascular: Positive for  chest pain (on deep inspiration ).  Gastrointestinal: Positive for nausea.   Physical Exam   Blood pressure 133/78,  pulse 89, temperature 98 F (36.7 C), resp. rate 18, height '5\' 6"'$  (1.676 m), weight 180 lb (81.6 kg), last menstrual period 01/30/2017, SpO2 100 %.   Patient Vitals for the past 24 hrs:  BP Temp Pulse Resp SpO2 Height Weight  05/24/17 1158 - - - - 96 % - -  05/24/17 1153 - - - - 95 % - -  05/24/17 1148 - - - - 100 % - -  05/24/17 1143 - - - - 100 % - -  05/24/17 1141 118/61 - 88 - - - -  05/24/17 1138 - - - - 100 % - -  05/24/17 1131 133/78 - 89 - - - -  05/24/17 1128 - - - - 100 % - -  05/24/17 1123 - - - - 100 % - -  05/24/17 1118 - - - - 100 % - -  05/24/17 1114 131/78 - 88 - - - -  05/24/17 1113 - - - - 97 % - -  05/24/17 1102 138/80 98 F (36.7 C) 95 18 100 % '5\' 6"'$  (1.676 m) 180 lb (81.6 kg)   Physical Exam  Constitutional: She is oriented to person, place, and time. She appears well-developed and well-nourished. No distress.  Cardiovascular: Normal rate and regular rhythm.   Respiratory: Effort normal and breath sounds normal. No respiratory distress. She has no wheezes. She has no rales. She exhibits no tenderness.  GI: Soft.  Musculoskeletal: Normal range of motion.  Neurological: She is alert and oriented to person, place, and time.  Skin: Skin is warm. She is not diaphoretic.  Psychiatric: Her behavior is normal.   MAU Course  Procedures  None  MDM  + fetal heart tones via doppler  EKG WNL  Troponin 1 WNL GI cocktail  Pulse ox 98% on RX: patient is laying flat.  Phenergan 25 PO, patient vomited the phenergan up. Phenergan 25 Mg IM  Patient with relief of 3/10 from 8/10. Patient says she is ready to go home. Discharge pain score is 0/10 Discussed patient with Dr. Matthew Saras ok for DC home.   Assessment and Plan   A:  1. Gastroesophageal reflux disease, esophagitis presence not specified   2. Chest pain due to GERD   3. Nausea and vomiting in pregnancy   4. Elevated liver enzymes     P:  Discharge home in stable condition Rx: Zantac, phenergan Patient  to follow up with the office this week for recheck of liver enzymes.  Return to MAU if symptoms worsen Avoid spicy foods.  Lezlie Lye, NP 05/24/2017 6:32 PM

## 2017-05-24 NOTE — MAU Note (Signed)
Pt. Was at work and noticed she was nauseated and had chest type pain that went to her back.

## 2017-06-25 ENCOUNTER — Ambulatory Visit: Payer: 59 | Admitting: Diagnostic Neuroimaging

## 2017-07-27 ENCOUNTER — Ambulatory Visit: Payer: 59 | Admitting: Diagnostic Neuroimaging

## 2017-07-30 ENCOUNTER — Inpatient Hospital Stay (HOSPITAL_COMMUNITY)
Admission: AD | Admit: 2017-07-30 | Discharge: 2017-07-30 | Disposition: A | Discharge status: Home / Self Care | Payer: 59 | Source: Ambulatory Visit | Attending: Obstetrics and Gynecology | Admitting: Obstetrics and Gynecology

## 2017-07-30 ENCOUNTER — Encounter (HOSPITAL_COMMUNITY): Payer: Self-pay | Admitting: *Deleted

## 2017-07-30 ENCOUNTER — Inpatient Hospital Stay (HOSPITAL_COMMUNITY): Payer: 59

## 2017-07-30 DIAGNOSIS — Z3686 Encounter for antenatal screening for cervical length: Secondary | ICD-10-CM | POA: Diagnosis not present

## 2017-07-30 DIAGNOSIS — O479 False labor, unspecified: Secondary | ICD-10-CM | POA: Diagnosis not present

## 2017-07-30 DIAGNOSIS — R102 Pelvic and perineal pain: Secondary | ICD-10-CM | POA: Diagnosis present

## 2017-07-30 DIAGNOSIS — O26899 Other specified pregnancy related conditions, unspecified trimester: Secondary | ICD-10-CM

## 2017-07-30 DIAGNOSIS — O26892 Other specified pregnancy related conditions, second trimester: Secondary | ICD-10-CM | POA: Insufficient documentation

## 2017-07-30 DIAGNOSIS — R109 Unspecified abdominal pain: Secondary | ICD-10-CM | POA: Diagnosis present

## 2017-07-30 DIAGNOSIS — Z3A25 25 weeks gestation of pregnancy: Secondary | ICD-10-CM | POA: Diagnosis not present

## 2017-07-30 DIAGNOSIS — N949 Unspecified condition associated with female genital organs and menstrual cycle: Secondary | ICD-10-CM

## 2017-07-30 LAB — URINALYSIS, ROUTINE W REFLEX MICROSCOPIC
Bilirubin Urine: NEGATIVE
Glucose, UA: NEGATIVE mg/dL
HGB URINE DIPSTICK: NEGATIVE
KETONES UR: NEGATIVE mg/dL
Leukocytes, UA: NEGATIVE
NITRITE: NEGATIVE
PH: 7 (ref 5.0–8.0)
Protein, ur: NEGATIVE mg/dL
SPECIFIC GRAVITY, URINE: 1.01 (ref 1.005–1.030)

## 2017-07-30 LAB — FETAL FIBRONECTIN: Fetal Fibronectin: NEGATIVE

## 2017-07-30 MED ORDER — NIFEDIPINE 10 MG PO CAPS
20.0000 mg | ORAL_CAPSULE | Freq: Once | ORAL | Status: AC
Start: 2017-07-30 — End: 2017-07-30
  Administered 2017-07-30: 20 mg via ORAL
  Filled 2017-07-30: qty 2

## 2017-07-30 NOTE — MAU Note (Signed)
Pt presents w/ c/o intermittent abdominal pain located @ umbilicus that began today.  Denies VB.  Pt also reports pelvis pressure that began 2 days ago which has worsened despite keeping legs elevated and pushing po fluids.

## 2017-07-30 NOTE — Progress Notes (Signed)
Fetal fibronectin obtained by NP.

## 2017-07-30 NOTE — MAU Note (Signed)
Pt reports vaginal pressure x 3 days, worsening today. Also reports sharp pain at umbilicus

## 2017-07-30 NOTE — MAU Provider Note (Signed)
History     CSN: [id number]  Arrival date and time: 07/30/17 1639   None     Chief Complaint  Patient presents with  . Pelvic Pain  . Abdominal Pain   HPI   [name] is a 31 y.o. female G1P0 @ 9349w6d here in MAU with abdominal pain. States the pain is located at her naval and lower abdomen. The pain is sharp in nature. The pain comes and goes. She also feels pressure. Says the pain has been Present for 2 days. The pain worsens when she is up walking or changing positions. She denies vaginal bleeding. + fetal movement.   OB History    Gravida Para Term Preterm AB Living   1             SAB TAB Ectopic Multiple Live Births                  Past Medical History:  Diagnosis Date  . Diabetes mellitus without complication (HCC)   . Headache(784.0) 02/28/2010   migraines  . HYPERTENSION 02/28/2010  . Pregnancy 03/05/2017   about 7 wks now  . Seizures (HCC)     Past Surgical History:  Procedure Laterality Date  . NO PAST SURGERIES      Family History  Problem Relation Age of Onset  . Stroke Mother   . Hypertension Mother   . Mental illness Mother   . Diabetes Mother   . Kidney failure Mother   . Arthritis Mother   . Hypertension Father   . Diabetes Father   . Stroke Father   . Hypertension Sister   . Hypercholesterolemia Sister   . Alcohol abuse Brother   . Mental illness Brother   . Sudden death Maternal Grandfather [id number]  . Diabetes Maternal Grandmother   . Diabetes Paternal Grandmother     Social History  Substance Use Topics  . Smoking status: Never Smoker  . Smokeless tobacco: Never Used  . Alcohol use No    Allergies:  Allergies  Allergen Reactions  . Bactrim Hives    Prescriptions Prior to Admission  Medication Sig Dispense Refill Last Dose  . FOLIC ACID PO Take 1 tablet by mouth daily.   07/30/2017 at Unknown time  . labetalol (NORMODYNE) 200 MG tablet Take 200 mg by mouth daily.   3 07/30/2017 at 0800  . metFORMIN (GLUCOPHAGE) 1000 MG  tablet Take 1,000 mg by mouth daily.    07/30/2017 at Unknown time  . Prenatal Vit-Fe Fumarate-FA (PRENATAL MULTIVITAMIN) TABS tablet Take 1 tablet by mouth daily at 12 noon.   07/30/2017 at Unknown time  . ranitidine (ZANTAC) 150 MG tablet Take 1 tablet (150 mg total) by mouth 2 (two) times daily. (Patient taking differently: Take 150 mg by mouth 2 (two) times daily as needed for heartburn. ) 60 tablet 1 07/30/2017 at Unknown time  . promethazine (PHENERGAN) 12.5 MG tablet Take 1 tablet (12.5 mg total) by mouth every 6 (six) hours as needed for nausea or vomiting. (Patient not taking: Reported on 07/30/2017) 30 tablet 0 Not Taking at Unknown time   Results for orders placed or performed during the hospital encounter of 07/30/17 (from the past 48 hour(s))  Urinalysis, Routine w reflex microscopic     Status: Abnormal   Collection Time: 07/30/17  4:51 PM  Result Value Ref Range   Color, Urine YELLOW YELLOW   APPearance HAZY (A) CLEAR   Specific Gravity, Urine 1.010 1.005 - 1.030   pH  7.0 5.0 - 8.0   Glucose, UA NEGATIVE NEGATIVE mg/dL   Hgb urine dipstick NEGATIVE NEGATIVE   Bilirubin Urine NEGATIVE NEGATIVE   Ketones, ur NEGATIVE NEGATIVE mg/dL   Protein, ur NEGATIVE NEGATIVE mg/dL   Nitrite NEGATIVE NEGATIVE   Leukocytes, UA NEGATIVE NEGATIVE  Fetal fibronectin     Status: None   Collection Time: 07/30/17  6:20 PM  Result Value Ref Range   Fetal Fibronectin NEGATIVE NEGATIVE    Review of Systems  Gastrointestinal: Positive for abdominal pain. Negative for constipation, diarrhea, nausea and vomiting.  Genitourinary: Negative for dysuria, vaginal bleeding and vaginal discharge.   Physical Exam   Blood pressure 129/83, pulse 86, temperature 98.1 F (36.7 C), temperature source Oral, resp. rate 19, height 5\' 6"  (1.676 m), weight 198 lb (89.8 kg), last menstrual period 01/30/2017, SpO2 99 %.  Physical Exam  Constitutional: She is oriented to person, place, and time. She appears  well-developed and well-nourished. She appears distressed.  GI: Soft. Normal appearance. There is tenderness in the periumbilical area and suprapubic area. There is no rigidity, no rebound and no guarding.  Genitourinary:  Genitourinary Comments: Dilation: Closed Effacement (%): Thick, anterior  Exam by:: Blanche East, NP  Neurological: She is alert and oriented to person, place, and time.  Skin: Skin is warm. She is not diaphoretic.  Psychiatric: Her behavior is normal.   Fetal Tracing: Baseline: 150 bpm Variability: Moderate  Accelerations: 10x10 Decelerations: quick variables  Toco: Occasional contraction, UI  [place]: cervical length 2.8cm Vertex  Results for orders placed or performed during the hospital encounter of 07/30/17 (from the past 24 hour(s))  Urinalysis, Routine w reflex microscopic     Status: Abnormal   Collection Time: 07/30/17  4:51 PM  Result Value Ref Range   Color, Urine YELLOW YELLOW   APPearance HAZY (A) CLEAR   Specific Gravity, Urine 1.010 1.005 - 1.030   pH 7.0 5.0 - 8.0   Glucose, UA NEGATIVE NEGATIVE mg/dL   Hgb urine dipstick NEGATIVE NEGATIVE   Bilirubin Urine NEGATIVE NEGATIVE   Ketones, ur NEGATIVE NEGATIVE mg/dL   Protein, ur NEGATIVE NEGATIVE mg/dL   Nitrite NEGATIVE NEGATIVE   Leukocytes, UA NEGATIVE NEGATIVE  Fetal fibronectin     Status: None   Collection Time: 07/30/17  6:20 PM  Result Value Ref Range   Fetal Fibronectin NEGATIVE NEGATIVE     MAU Course  Procedures  None  MDM  Occasional contraction noted on toco: Procardia given 20 mg X 1 Oral fluid hydration Fetal fibronectin negative [place] for cervical length. Awaiting to go  Report given to Thressa Sheller CNM who resumes care of the patient.  2040: UI has stopped on the toco at this time. Patient appears more comfortable.  2044: DW Dr. Vincente Poli ok for DC home.    Assessment and Plan   1. Braxton Hick's contraction   2. Abdominal pain affecting pregnancy   3. [redacted] weeks  gestation of pregnancy   4. Round ligament pain    DC home Comfort measures reviewed  3rd Trimester precautions  PTL precautions  Fetal kick counts RX: none  Return to MAU as needed FU with OB as planned  Follow-up Information    Marcelle Overlie, MD Follow up.   Specialty:  Obstetrics and Gynecology Contact information: [address] [place] [address] [phone]

## 2017-07-30 NOTE — Discharge Instructions (Signed)
Preterm Labor and Birth Information Pregnancy normally lasts 39-41 weeks. Preterm labor is when labor starts early. It starts before you have been pregnant for 37 whole weeks. What are the risk factors for preterm labor? Preterm labor is more likely to occur in women who:  Have an infection while pregnant.  Have a cervix that is short.  Have gone into preterm labor before.  Have had surgery on their cervix.  Are younger than age 32.  Are older than age 32.  Are African American.  Are pregnant with two or more babies.  Take street drugs while pregnant.  Smoke while pregnant.  Do not gain enough weight while pregnant.  Got pregnant right after another pregnancy.  What are the symptoms of preterm labor? Symptoms of preterm labor include:  Cramps. The cramps may feel like the cramps some women get during their period. The cramps may happen with watery poop (diarrhea).  Pain in the belly (abdomen).  Pain in the lower back.  Regular contractions or tightening. It may feel like your belly is getting tighter.  Pressure in the lower belly that seems to get stronger.  More fluid (discharge) leaking from the vagina. The fluid may be watery or bloody.  Water breaking.  Why is it important to notice signs of preterm labor? Babies who are born early may not be fully developed. They have a higher chance for:  Long-term heart problems.  Long-term lung problems.  Trouble controlling body systems, like breathing.  Bleeding in the brain.  A condition called cerebral palsy.  Learning difficulties.  Death.  These risks are highest for babies who are born before 34 weeks of pregnancy. How is preterm labor treated? Treatment depends on:  How long you were pregnant.  Your condition.  The health of your baby.  Treatment may involve:  Having a stitch (suture) placed in your cervix. When you give birth, your cervix opens so the baby can come out. The stitch keeps the  cervix from opening too soon.  Staying at the hospital.  Taking or getting medicines, such as: ? Hormone medicines. ? Medicines to stop contractions. ? Medicines to help the babys lungs develop. ? Medicines to prevent your baby from having cerebral palsy.  What should I do if I am in preterm labor? If you think you are going into labor too soon, call your doctor right away. How can I prevent preterm labor?  Do not use any tobacco products. ? Examples of these are cigarettes, chewing tobacco, and e-cigarettes. ? If you need help quitting, ask your doctor.  Do not use street drugs.  Do not use any medicines unless you ask your doctor if they are safe for you.  Talk with your doctor before taking any herbal supplements.  Make sure you gain enough weight.  Watch for infection. If you think you might have an infection, get it checked right away.  If you have gone into preterm labor before, tell your doctor. This information is not intended to replace advice given to you by your health care provider. Make sure you discuss any questions you have with your health care provider. Document Released: 02/12/2009 Document Revised: 04/28/2016 Document Reviewed: 04/08/2016 Elsevier Interactive Patient Education  2018 [employer]  Round Ligament Pain during Pregnancy Many women will experience a type of pain referred to as round ligament pain during their pregnancy. This is associated with abdominal pain or discomfort. Since any type of abdominal pain during pregnancy can be disconcerting, it is  important to talk about round ligament pain to relieve any anxiety or fears you may have regarding the symptoms you are feeling. Round ligament pain is due to normal changes that take place in the body during pregnancy. It is caused by stretching of the round ligaments attached to the uterus. More commonly it occurs on the right side of the pelvis. Round Ligament: An Overview Typically in the  non-pregnant state the uterus is about the size of an apple or pear. There are thick ligaments which hold the uterus in place in the abdomen, referred to as round ligaments. During pregnancy, your uterus will expand in size and weight, and the ligaments supporting it will have to stretch, becoming longer and thinner. As these ligaments pull and tug they may irritate nearby nerve fibers, which causes pain. The severity of the pain in some cases can seem extreme. Some common symptoms of round ligament pain include:  Ligament spasms or contractions/cramps that trigger a sharp pain typically on the right side of the abdomen.  Pain upon waking or suddenly rolling over in your sleep.  Pain in the abdomen that is sharp brought on by exercise or other vigorous activity. Similar Problems Round ligament pain is often mistaken for other medical conditions because the symptoms are similar. Acute abdominal pain during pregnancy may also be a sign of other conditions including:  Abdominal cramps - Some abdominal pain is simply caused by change in bowel habits associated with pregnancy. Gas is a common problem that can cause sharp, shooting pain. You should always seek out medical care if your pain is accompanied by fever, chills, pain upon urination or if you have difficulty walking. Further exams and tests will be conducted to ensure that you do not have a more serious condition. It is not uncommon for women with lower abdominal pain to have a urinary tract infection, thus you may also be asked for a urine sample. Treatment If all other conditions are ruled out you can treat your round ligament pain relatively easily. You may be advised to take some acetaminophen (Tylenol) to reduce the severity of any persistent pain and asked to reduce your activity level. You can apply a heating pad to the area of pain or take a warm bath. Lying on the opposite side of the pain may help as well. Most women will  find relief from round ligament pain simply by altering their daily routines slightly. The good news is round ligament pain will disappear completely once you have given birth to your child!

## 2017-08-16 ENCOUNTER — Ambulatory Visit: Payer: 59 | Admitting: Diagnostic Neuroimaging

## 2017-08-17 ENCOUNTER — Encounter: Payer: Self-pay | Admitting: Diagnostic Neuroimaging

## 2017-10-11 ENCOUNTER — Telehealth (HOSPITAL_COMMUNITY): Payer: Self-pay | Admitting: *Deleted

## 2017-10-11 ENCOUNTER — Encounter (HOSPITAL_COMMUNITY): Payer: Self-pay | Admitting: *Deleted

## 2017-10-11 NOTE — Telephone Encounter (Signed)
Preadmission screen  

## 2017-10-15 ENCOUNTER — Inpatient Hospital Stay (HOSPITAL_COMMUNITY)
Admission: AD | Admit: 2017-10-15 | Discharge: 2017-10-15 | Disposition: A | Discharge status: Home / Self Care | Payer: 59 | Source: Ambulatory Visit | Attending: Obstetrics and Gynecology | Admitting: Obstetrics and Gynecology

## 2017-10-15 ENCOUNTER — Other Ambulatory Visit: Payer: Self-pay

## 2017-10-15 ENCOUNTER — Encounter (HOSPITAL_COMMUNITY): Payer: Self-pay | Admitting: *Deleted

## 2017-10-15 DIAGNOSIS — O10919 Unspecified pre-existing hypertension complicating pregnancy, unspecified trimester: Secondary | ICD-10-CM

## 2017-10-15 DIAGNOSIS — O163 Unspecified maternal hypertension, third trimester: Secondary | ICD-10-CM | POA: Insufficient documentation

## 2017-10-15 DIAGNOSIS — Z3A36 36 weeks gestation of pregnancy: Secondary | ICD-10-CM | POA: Insufficient documentation

## 2017-10-15 DIAGNOSIS — Z3689 Encounter for other specified antenatal screening: Secondary | ICD-10-CM

## 2017-10-15 DIAGNOSIS — O10913 Unspecified pre-existing hypertension complicating pregnancy, third trimester: Secondary | ICD-10-CM

## 2017-10-15 LAB — COMPREHENSIVE METABOLIC PANEL
ALT: 18 U/L (ref 14–54)
ANION GAP: 10 (ref 5–15)
AST: 24 U/L (ref 15–41)
Albumin: 3 g/dL — ABNORMAL LOW (ref 3.5–5.0)
Alkaline Phosphatase: 141 U/L — ABNORMAL HIGH (ref 38–126)
BUN: 5 mg/dL — AB (ref 6–20)
CHLORIDE: 104 mmol/L (ref 101–111)
CO2: 19 mmol/L — ABNORMAL LOW (ref 22–32)
Calcium: 8.7 mg/dL — ABNORMAL LOW (ref 8.9–10.3)
Creatinine, Ser: 0.46 mg/dL (ref 0.44–1.00)
GFR calc Af Amer: >60 mL/min (ref >60)
Glucose, Bld: 99 mg/dL (ref 65–99)
POTASSIUM: 3.7 mmol/L (ref 3.5–5.1)
Sodium: 133 mmol/L — ABNORMAL LOW (ref 135–145)
Total Bilirubin: 0.3 mg/dL (ref 0.3–1.2)
Total Protein: 6.8 g/dL (ref 6.5–8.1)

## 2017-10-15 LAB — CBC
HCT: 35.7 % — ABNORMAL LOW (ref 36.0–46.0)
Hemoglobin: 11.9 g/dL — ABNORMAL LOW (ref 12.0–15.0)
MCH: 29.2 pg (ref 26.0–34.0)
MCHC: 33.3 g/dL (ref 30.0–36.0)
MCV: 87.7 fL (ref 78.0–100.0)
PLATELETS: 265 10*3/uL (ref 150–400)
RBC: 4.07 MIL/uL (ref 3.87–5.11)
RDW: 12.8 % (ref 11.5–15.5)
WBC: 9.4 10*3/uL (ref 4.0–10.5)

## 2017-10-15 LAB — PROTEIN / CREATININE RATIO, URINE
CREATININE, URINE: 83 mg/dL
PROTEIN CREATININE RATIO: 0.12 mg/mg{creat} (ref 0.00–0.15)
Total Protein, Urine: 10 mg/dL

## 2017-10-15 LAB — URINALYSIS, ROUTINE W REFLEX MICROSCOPIC
BILIRUBIN URINE: NEGATIVE
GLUCOSE, UA: NEGATIVE mg/dL
HGB URINE DIPSTICK: NEGATIVE
KETONES UR: NEGATIVE mg/dL
Leukocytes, UA: NEGATIVE
Nitrite: NEGATIVE
PH: 6 (ref 5.0–8.0)
Protein, ur: NEGATIVE mg/dL
Specific Gravity, Urine: 1.009 (ref 1.005–1.030)

## 2017-10-15 NOTE — Discharge Instructions (Signed)

## 2017-10-15 NOTE — MAU Note (Signed)
Sent over from office, BP up.   HA yesterday, +increase in swelling in feet, denies visual changes or epigastric pain.  Concerns about blood sugar, numbers have been up

## 2017-10-15 NOTE — MAU Provider Note (Signed)
History     CSN: [id number]  Arrival date and time: 10/15/17 1204   First Provider Initiated Contact with Patient 10/15/17 1255     Chief Complaint  Patient presents with  . Hypertension  . Blood Sugar Problem   HPI Anna Rhodes is a 31 y.o. G1P0 at 4621w6d who presents from the office for evaluation of elevated blood pressures. She has chronic hypertension and is prescribed labetalol 200mg  BID, but she states she only takes the morning dose because the night dose "makes me feel bad." In the office, she had a blood pressure in severe range. She denies any headache, visual changes or epigastric pain. Also in the office today, she had a non reactive NST, but reports good fetal movement. She has type 2 diabetes and takes 1000mg  of metformin BID. She denies any abdominal pain, vaginal bleeding or leaking of fluid.   OB History    Gravida Para Term Preterm AB Living   1             SAB TAB Ectopic Multiple Live Births                  Past Medical History:  Diagnosis Date  . Diabetes mellitus without complication (HCC)   . Headache(784.0) 02/28/2010   migraines  . HYPERTENSION 02/28/2010  . Pregnancy 03/05/2017   about 7 wks now  . Seizures (HCC)    last seizure 15 years ago meds stopped 11/2016 per doc request  . Vaginal Pap smear, abnormal     Past Surgical History:  Procedure Laterality Date  . LEEP    . NO PAST SURGERIES      Family History  Problem Relation Age of Onset  . Stroke Mother   . Hypertension Mother   . Mental illness Mother        schizophrenia  . Diabetes Mother   . Kidney failure Mother   . Arthritis Mother   . Dementia Mother   . Hypertension Father   . Diabetes Father   . Stroke Father   . Hypertension Sister   . Hypercholesterolemia Sister   . Alcohol abuse Brother   . Mental illness Brother        schizophrenia  . Diabetes Maternal Grandmother   . Diabetes Paternal Grandmother     Social History   Tobacco Use  . Smoking status: Never  Smoker  . Smokeless tobacco: Never Used  Substance Use Topics  . Alcohol use: No  . Drug use: No    Allergies:  Allergies  Allergen Reactions  . Bactrim Hives    Medications Prior to Admission  Medication Sig Dispense Refill Last Dose  . FOLIC ACID PO Take 400 mcg daily by mouth.    10/15/2017 at Unknown time  . labetalol (NORMODYNE) 200 MG tablet Take 200 mg by mouth daily.   3 10/15/2017 at 0830  . metFORMIN (GLUCOPHAGE) 1000 MG tablet Take 1,000 mg 2 (two) times daily by mouth.    10/15/2017 at Unknown time  . Prenatal Vit-Fe Fumarate-FA (PRENATAL MULTIVITAMIN) TABS tablet Take 1 tablet by mouth daily at 12 noon.   10/15/2017 at Unknown time  . ranitidine (ZANTAC) 150 MG tablet Take 1 tablet (150 mg total) by mouth 2 (two) times daily. (Patient taking differently: Take 150 mg by mouth 2 (two) times daily as needed for heartburn. ) 60 tablet 1 10/14/2017 at Unknown time  . promethazine (PHENERGAN) 12.5 MG tablet Take 1 tablet (12.5 mg total) by mouth every  6 (six) hours as needed for nausea or vomiting. (Patient not taking: Reported on 07/30/2017) 30 tablet 0 Not Taking at Unknown time    Review of Systems  Constitutional: Negative.  Negative for fatigue and fever.  HENT: Negative.   Eyes: Negative for visual disturbance.  Respiratory: Negative.  Negative for shortness of breath.   Cardiovascular: Negative.  Negative for chest pain.  Gastrointestinal: Negative.  Negative for abdominal pain, constipation, diarrhea, nausea and vomiting.  Genitourinary: Negative.  Negative for dysuria, vaginal bleeding and vaginal discharge.  Neurological: Negative.  Negative for dizziness and headaches.   Physical Exam   Blood pressure (!) 142/93, pulse 99, temperature 97.8 F (36.6 C), temperature source Oral, resp. rate 18, weight 214 lb (97.1 kg), last menstrual period 01/30/2017, SpO2 100 %.  Physical Exam  Nursing note and vitals reviewed. Constitutional: She is oriented to person, place,  and time. She appears well-developed and well-nourished. No distress.  HENT:  Head: Normocephalic.  Eyes: Pupils are equal, round, and reactive to light.  Cardiovascular: Normal rate, regular rhythm and normal heart sounds.  Respiratory: Effort normal and breath sounds normal. No respiratory distress.  GI: Soft. Bowel sounds are normal. She exhibits no distension. There is no tenderness.  Neurological: She is alert and oriented to person, place, and time. She displays normal reflexes.  Skin: Skin is warm and dry.  Psychiatric: She has a normal mood and affect. Her behavior is normal. Judgment and thought content normal.   Fetal Tracing:  Baseline: 150 Variability: moderate Accels: 15x15 Decels: none  Toco: none  MAU Course  Procedures Results for orders placed or performed during the hospital encounter of 10/15/17 (from the past 24 hour(s))  Protein / creatinine ratio, urine     Status: None   Collection Time: 10/15/17 12:18 PM  Result Value Ref Range   Creatinine, Urine 83.00 mg/dL   Total Protein, Urine 10 mg/dL   Protein Creatinine Ratio 0.12 0.00 - 0.15 mg/mg[Cre]  Urinalysis, Routine w reflex microscopic     Status: Abnormal   Collection Time: 10/15/17 12:18 PM  Result Value Ref Range   Color, Urine YELLOW YELLOW   APPearance HAZY (A) CLEAR   Specific Gravity, Urine 1.009 1.005 - 1.030   pH 6.0 5.0 - 8.0   Glucose, UA NEGATIVE NEGATIVE mg/dL   Hgb urine dipstick NEGATIVE NEGATIVE   Bilirubin Urine NEGATIVE NEGATIVE   Ketones, ur NEGATIVE NEGATIVE mg/dL   Protein, ur NEGATIVE NEGATIVE mg/dL   Nitrite NEGATIVE NEGATIVE   Leukocytes, UA NEGATIVE NEGATIVE  CBC     Status: Abnormal   Collection Time: 10/15/17  1:07 PM  Result Value Ref Range   WBC 9.4 4.0 - 10.5 K/uL   RBC 4.07 3.87 - 5.11 MIL/uL   Hemoglobin 11.9 (L) 12.0 - 15.0 g/dL   HCT 16.135.7 (L) 09.636.0 - 04.546.0 %   MCV 87.7 78.0 - 100.0 fL   MCH 29.2 26.0 - 34.0 pg   MCHC 33.3 30.0 - 36.0 g/dL   RDW 40.912.8 81.111.5 -  91.415.5 %   Platelets 265 150 - 400 K/uL  Comprehensive metabolic panel     Status: Abnormal   Collection Time: 10/15/17  1:07 PM  Result Value Ref Range   Sodium 133 (L) 135 - 145 mmol/L   Potassium 3.7 3.5 - 5.1 mmol/L   Chloride 104 101 - 111 mmol/L   CO2 19 (L) 22 - 32 mmol/L   Glucose, Bld 99 65 - 99 mg/dL   BUN  5 (L) 6 - 20 mg/dL   Creatinine, Ser 1.61 0.44 - 1.00 mg/dL   Calcium 8.7 (L) 8.9 - 10.3 mg/dL   Total Protein 6.8 6.5 - 8.1 g/dL   Albumin 3.0 (L) 3.5 - 5.0 g/dL   AST 24 15 - 41 U/L   ALT 18 14 - 54 U/L   Alkaline Phosphatase 141 (H) 38 - 126 U/L   Total Bilirubin 0.3 0.3 - 1.2 mg/dL   GFR calc non Af Amer >60 >60 mL/min   GFR calc Af Amer >60 >60 mL/min   Anion gap 10 5 - 15   MDM UA CBC, CMP, Protein/creat ratio NST- reactive  Reviewed results with Dr. Henderson Cloud- will stress the importance of taking both doses of labetalol daily and patient is scheduled for follow up in the office on Monday  Assessment and Plan   1. [redacted] weeks gestation of pregnancy   2. Chronic hypertension during pregnancy, antepartum   3. NST (non-stress test) reactive    -Discharge home in stable condition -Importance of taking both doses of labetalol stressed to patient -Preeclampsia precautions discussed -Patient advised to follow-up with [facility] on Monday as scheduled for prenatal care and a blood pressure check -Patient may return to MAU as needed or if her condition were to change or worsen  Rolm Bookbinder CNM 10/15/2017, 1:45 PM

## 2017-10-15 NOTE — MAU Note (Signed)
Urine in lab 

## 2017-10-20 ENCOUNTER — Other Ambulatory Visit (HOSPITAL_COMMUNITY): Payer: Self-pay | Admitting: Obstetrics & Gynecology

## 2017-10-23 ENCOUNTER — Inpatient Hospital Stay (HOSPITAL_COMMUNITY): Payer: 59 | Admitting: Anesthesiology

## 2017-10-23 ENCOUNTER — Other Ambulatory Visit: Payer: Self-pay

## 2017-10-23 ENCOUNTER — Encounter (HOSPITAL_COMMUNITY): Payer: Self-pay | Admitting: Anesthesiology

## 2017-10-23 ENCOUNTER — Encounter (HOSPITAL_COMMUNITY): Payer: Self-pay

## 2017-10-23 ENCOUNTER — Inpatient Hospital Stay (HOSPITAL_COMMUNITY)
Admission: RE | Admit: 2017-10-23 | Discharge: 2017-10-25 | DRG: 806 | Disposition: A | Discharge status: Home / Self Care | Payer: 59 | Source: Ambulatory Visit | Attending: Obstetrics & Gynecology | Admitting: Obstetrics & Gynecology

## 2017-10-23 VITALS — BP 148/83 | HR 88 | Temp 98.4°F | Resp 18 | Ht 66.5 in | Wt 218.0 lb

## 2017-10-23 DIAGNOSIS — Z23 Encounter for immunization: Secondary | ICD-10-CM | POA: Diagnosis not present

## 2017-10-23 DIAGNOSIS — O1002 Pre-existing essential hypertension complicating childbirth: Secondary | ICD-10-CM | POA: Diagnosis present

## 2017-10-23 DIAGNOSIS — O24425 Gestational diabetes mellitus in childbirth, controlled by oral hypoglycemic drugs: Principal | ICD-10-CM | POA: Diagnosis present

## 2017-10-23 DIAGNOSIS — Z3A38 38 weeks gestation of pregnancy: Secondary | ICD-10-CM | POA: Diagnosis not present

## 2017-10-23 DIAGNOSIS — O99824 Streptococcus B carrier state complicating childbirth: Secondary | ICD-10-CM | POA: Diagnosis present

## 2017-10-23 DIAGNOSIS — Z3490 Encounter for supervision of normal pregnancy, unspecified, unspecified trimester: Secondary | ICD-10-CM

## 2017-10-23 LAB — CBC
HCT: 33.3 % — ABNORMAL LOW (ref 36.0–46.0)
HEMATOCRIT: 37.2 % (ref 36.0–46.0)
HEMATOCRIT: 36.6 % (ref 36.0–46.0)
HEMOGLOBIN: 12 g/dL (ref 12.0–15.0)
HEMOGLOBIN: 12.2 g/dL (ref 12.0–15.0)
Hemoglobin: 11.2 g/dL — ABNORMAL LOW (ref 12.0–15.0)
MCH: 29.4 pg (ref 26.0–34.0)
MCH: 28.5 pg (ref 26.0–34.0)
MCH: 29.4 pg (ref 26.0–34.0)
MCHC: 33.6 g/dL (ref 30.0–36.0)
MCHC: 32.3 g/dL (ref 30.0–36.0)
MCHC: 33.3 g/dL (ref 30.0–36.0)
MCV: 87.4 fL (ref 78.0–100.0)
MCV: 88.4 fL (ref 78.0–100.0)
MCV: 88.2 fL (ref 78.0–100.0)
PLATELETS: 246 10*3/uL (ref 150–400)
Platelets: 262 10*3/uL (ref 150–400)
Platelets: 255 10*3/uL (ref 150–400)
RBC: 3.81 MIL/uL — ABNORMAL LOW (ref 3.87–5.11)
RBC: 4.21 MIL/uL (ref 3.87–5.11)
RBC: 4.15 MIL/uL (ref 3.87–5.11)
RDW: 13.1 % (ref 11.5–15.5)
RDW: 13.2 % (ref 11.5–15.5)
RDW: 13.1 % (ref 11.5–15.5)
WBC: 9.3 10*3/uL (ref 4.0–10.5)
WBC: 12.2 10*3/uL — AB (ref 4.0–10.5)
WBC: 13.6 10*3/uL — AB (ref 4.0–10.5)

## 2017-10-23 LAB — TYPE AND SCREEN
ABO/RH(D): O POS
Antibody Screen: NEGATIVE

## 2017-10-23 LAB — GLUCOSE, CAPILLARY
GLUCOSE-CAPILLARY: 90 mg/dL (ref 65–99)
GLUCOSE-CAPILLARY: 109 mg/dL — AB (ref 65–99)
GLUCOSE-CAPILLARY: 107 mg/dL — AB (ref 65–99)
GLUCOSE-CAPILLARY: 145 mg/dL — AB (ref 65–99)
Glucose-Capillary: 128 mg/dL — ABNORMAL HIGH (ref 65–99)
Glucose-Capillary: 101 mg/dL — ABNORMAL HIGH (ref 65–99)
Glucose-Capillary: 116 mg/dL — ABNORMAL HIGH (ref 65–99)

## 2017-10-23 LAB — ABO/RH: ABO/RH(D): O POS

## 2017-10-23 LAB — RPR: RPR Ser Ql: NONREACTIVE

## 2017-10-23 MED ORDER — COCONUT OIL OIL
1.0000 "application " | TOPICAL_OIL | Status: DC | PRN
  Administered 2017-10-23: 1 via TOPICAL
  Filled 2017-10-23: qty 120

## 2017-10-23 MED ORDER — PRENATAL MULTIVITAMIN CH
1.0000 | ORAL_TABLET | Freq: Every day | ORAL | Status: DC
  Administered 2017-10-24 – 2017-10-25 (×2): 1 via ORAL
  Filled 2017-10-23 (×2): qty 1

## 2017-10-23 MED ORDER — ACETAMINOPHEN 325 MG PO TABS: 650.0000 mg | ORAL_TABLET | ORAL | Status: DC | PRN

## 2017-10-23 MED ORDER — LABETALOL HCL 5 MG/ML IV SOLN: 80.0000 mg | Freq: Once | INTRAVENOUS | Status: DC

## 2017-10-23 MED ORDER — LACTATED RINGERS IV SOLN
INTRAVENOUS | Status: DC
  Administered 2017-10-23 (×2): via INTRAVENOUS

## 2017-10-23 MED ORDER — TERBUTALINE SULFATE 1 MG/ML IJ SOLN: 0.2500 mg | Freq: Once | INTRAMUSCULAR | Status: DC | PRN

## 2017-10-23 MED ORDER — LACTATED RINGERS IV SOLN: 500.0000 mL | Freq: Once | INTRAVENOUS | Status: DC

## 2017-10-23 MED ORDER — EPHEDRINE 5 MG/ML INJ
10.0000 mg | INTRAVENOUS | Status: DC | PRN
  Filled 2017-10-23: qty 2

## 2017-10-23 MED ORDER — DIPHENHYDRAMINE HCL 50 MG/ML IJ SOLN: 12.5000 mg | INTRAMUSCULAR | Status: DC | PRN

## 2017-10-23 MED ORDER — TETANUS-DIPHTH-ACELL PERTUSSIS 5-2.5-18.5 LF-MCG/0.5 IM SUSP: 0.5000 mL | Freq: Once | INTRAMUSCULAR | Status: DC

## 2017-10-23 MED ORDER — PENICILLIN G POT IN DEXTROSE 60000 UNIT/ML IV SOLN
3.0000 10*6.[IU] | INTRAVENOUS | Status: DC
  Administered 2017-10-23 (×3): 3 10*6.[IU] via INTRAVENOUS
  Filled 2017-10-23 (×6): qty 50

## 2017-10-23 MED ORDER — LABETALOL HCL 5 MG/ML IV SOLN
80.0000 mg | Freq: Once | INTRAVENOUS | Status: AC
  Administered 2017-10-23: 80 mg via INTRAVENOUS
  Filled 2017-10-23: qty 16

## 2017-10-23 MED ORDER — DIBUCAINE 1 % RE OINT: 1.0000 "application " | TOPICAL_OINTMENT | RECTAL | Status: DC | PRN

## 2017-10-23 MED ORDER — PHENYLEPHRINE 40 MCG/ML (10ML) SYRINGE FOR IV PUSH (FOR BLOOD PRESSURE SUPPORT)
80.0000 ug | PREFILLED_SYRINGE | INTRAVENOUS | Status: DC | PRN
  Filled 2017-10-23: qty 5

## 2017-10-23 MED ORDER — ONDANSETRON HCL 4 MG PO TABS: 4.0000 mg | ORAL_TABLET | ORAL | Status: DC | PRN

## 2017-10-23 MED ORDER — ONDANSETRON HCL 4 MG/2ML IJ SOLN: 4.0000 mg | INTRAMUSCULAR | Status: DC | PRN

## 2017-10-23 MED ORDER — PNEUMOCOCCAL VAC POLYVALENT 25 MCG/0.5ML IJ INJ
0.5000 mL | INJECTION | INTRAMUSCULAR | Status: AC
  Administered 2017-10-25: 0.5 mL via INTRAMUSCULAR
  Filled 2017-10-23: qty 0.5

## 2017-10-23 MED ORDER — LIDOCAINE HCL (PF) 1 % IJ SOLN
30.0000 mL | INTRAMUSCULAR | Status: DC | PRN
  Filled 2017-10-23: qty 30

## 2017-10-23 MED ORDER — PHENYLEPHRINE 40 MCG/ML (10ML) SYRINGE FOR IV PUSH (FOR BLOOD PRESSURE SUPPORT)
80.0000 ug | PREFILLED_SYRINGE | INTRAVENOUS | Status: DC | PRN
  Filled 2017-10-23: qty 10
  Filled 2017-10-23: qty 5

## 2017-10-23 MED ORDER — OXYTOCIN 40 UNITS IN LACTATED RINGERS INFUSION - SIMPLE MED: 1.0000 m[IU]/min | INTRAVENOUS | Status: DC

## 2017-10-23 MED ORDER — DIPHENHYDRAMINE HCL 25 MG PO CAPS: 25.0000 mg | ORAL_CAPSULE | Freq: Four times a day (QID) | ORAL | Status: DC | PRN

## 2017-10-23 MED ORDER — OXYTOCIN BOLUS FROM INFUSION
500.0000 mL | Freq: Once | INTRAVENOUS | Status: AC
Start: 2017-10-23 — End: 2017-10-23
  Administered 2017-10-23: 500 mL via INTRAVENOUS

## 2017-10-23 MED ORDER — LACTATED RINGERS IV SOLN
INTRAVENOUS | Status: DC
  Administered 2017-10-24 (×2): via INTRAVENOUS

## 2017-10-23 MED ORDER — FENTANYL 2.5 MCG/ML BUPIVACAINE 1/10 % EPIDURAL INFUSION (WH - ANES)
14.0000 mL/h | INTRAMUSCULAR | Status: DC | PRN
  Administered 2017-10-23: 14 mL/h via EPIDURAL
  Filled 2017-10-23: qty 100

## 2017-10-23 MED ORDER — OXYCODONE-ACETAMINOPHEN 5-325 MG PO TABS: 2.0000 | ORAL_TABLET | ORAL | Status: DC | PRN

## 2017-10-23 MED ORDER — ZOLPIDEM TARTRATE 5 MG PO TABS: 5.0000 mg | ORAL_TABLET | Freq: Every evening | ORAL | Status: DC | PRN

## 2017-10-23 MED ORDER — BENZOCAINE-MENTHOL 20-0.5 % EX AERO
1.0000 "application " | INHALATION_SPRAY | CUTANEOUS | Status: DC | PRN
  Administered 2017-10-23: 1 via TOPICAL
  Filled 2017-10-23: qty 56

## 2017-10-23 MED ORDER — LABETALOL HCL 5 MG/ML IV SOLN
20.0000 mg | Freq: Once | INTRAVENOUS | Status: AC
  Administered 2017-10-23: 20 mg via INTRAVENOUS

## 2017-10-23 MED ORDER — ONDANSETRON HCL 4 MG/2ML IJ SOLN
4.0000 mg | Freq: Four times a day (QID) | INTRAMUSCULAR | Status: DC | PRN
  Administered 2017-10-23: 4 mg via INTRAVENOUS
  Filled 2017-10-23: qty 2

## 2017-10-23 MED ORDER — FENTANYL CITRATE (PF) 100 MCG/2ML IJ SOLN
50.0000 ug | INTRAMUSCULAR | Status: DC | PRN
  Administered 2017-10-23 (×2): 100 ug via INTRAVENOUS
  Filled 2017-10-23 (×2): qty 2

## 2017-10-23 MED ORDER — MAGNESIUM SULFATE BOLUS VIA INFUSION
6.0000 g | Freq: Once | INTRAVENOUS | Status: AC
  Administered 2017-10-23: 6 g via INTRAVENOUS
  Filled 2017-10-23: qty 500

## 2017-10-23 MED ORDER — LABETALOL HCL 5 MG/ML IV SOLN
INTRAVENOUS | Status: AC
  Filled 2017-10-23: qty 4

## 2017-10-23 MED ORDER — LIDOCAINE HCL (PF) 1 % IJ SOLN
INTRAMUSCULAR | Status: DC | PRN
  Administered 2017-10-23: 13 mL via EPIDURAL

## 2017-10-23 MED ORDER — FLEET ENEMA 7-19 GM/118ML RE ENEM: 1.0000 | ENEMA | RECTAL | Status: DC | PRN

## 2017-10-23 MED ORDER — OXYCODONE-ACETAMINOPHEN 5-325 MG PO TABS: 1.0000 | ORAL_TABLET | ORAL | Status: DC | PRN

## 2017-10-23 MED ORDER — LABETALOL HCL 200 MG PO TABS
200.0000 mg | ORAL_TABLET | Freq: Two times a day (BID) | ORAL | Status: DC
  Administered 2017-10-23 – 2017-10-25 (×4): 200 mg via ORAL
  Filled 2017-10-23 (×4): qty 1

## 2017-10-23 MED ORDER — MISOPROSTOL 25 MCG QUARTER TABLET
25.0000 ug | ORAL_TABLET | ORAL | Status: DC | PRN
  Administered 2017-10-23 (×2): 25 ug via VAGINAL
  Filled 2017-10-23 (×3): qty 1

## 2017-10-23 MED ORDER — SENNOSIDES-DOCUSATE SODIUM 8.6-50 MG PO TABS
2.0000 | ORAL_TABLET | ORAL | Status: DC
  Administered 2017-10-23 – 2017-10-24 (×2): 2 via ORAL
  Filled 2017-10-23 (×2): qty 2

## 2017-10-23 MED ORDER — PENICILLIN G POTASSIUM 5000000 UNITS IJ SOLR
5.0000 10*6.[IU] | Freq: Once | INTRAVENOUS | Status: AC
  Administered 2017-10-23: 5 10*6.[IU] via INTRAVENOUS
  Filled 2017-10-23: qty 5

## 2017-10-23 MED ORDER — WITCH HAZEL-GLYCERIN EX PADS: 1.0000 "application " | MEDICATED_PAD | CUTANEOUS | Status: DC | PRN

## 2017-10-23 MED ORDER — IBUPROFEN 600 MG PO TABS
600.0000 mg | ORAL_TABLET | Freq: Four times a day (QID) | ORAL | Status: DC
  Administered 2017-10-23 – 2017-10-25 (×7): 600 mg via ORAL
  Filled 2017-10-23 (×7): qty 1

## 2017-10-23 MED ORDER — LABETALOL HCL 5 MG/ML IV SOLN
40.0000 mg | Freq: Once | INTRAVENOUS | Status: AC
  Administered 2017-10-23: 40 mg via INTRAVENOUS
  Filled 2017-10-23: qty 8

## 2017-10-23 MED ORDER — SIMETHICONE 80 MG PO CHEW: 80.0000 mg | CHEWABLE_TABLET | ORAL | Status: DC | PRN

## 2017-10-23 MED ORDER — OXYTOCIN 40 UNITS IN LACTATED RINGERS INFUSION - SIMPLE MED
2.5000 [IU]/h | INTRAVENOUS | Status: DC
  Administered 2017-10-23: 2.5 [IU]/h via INTRAVENOUS
  Filled 2017-10-23: qty 1000

## 2017-10-23 MED ORDER — MAGNESIUM SULFATE 40 G IN LACTATED RINGERS - SIMPLE
2.0000 g/h | INTRAVENOUS | Status: AC
  Administered 2017-10-24: 2 g/h via INTRAVENOUS
  Filled 2017-10-23: qty 500
  Filled 2017-10-23: qty 40

## 2017-10-23 MED ORDER — LACTATED RINGERS IV SOLN: 500.0000 mL | INTRAVENOUS | Status: DC | PRN

## 2017-10-23 MED ORDER — SOD CITRATE-CITRIC ACID 500-334 MG/5ML PO SOLN: 30.0000 mL | ORAL | Status: DC | PRN

## 2017-10-23 NOTE — Anesthesia Pain Management Evaluation Note (Signed)
  CRNA Pain Management Visit Note  Patient: Anna Rhodes, 31 y.o., female  "Hello I am a [facility] of the anesthesia team [facility]. We have an anesthesia team available at all times to provide care throughout the hospital, including epidural management and anesthesia for C-section. I don't know your plan for the delivery whether it a natural birth, water birth, IV sedation, nitrous supplementation, doula or epidural, but we want to meet your pain goals."   1.Was your pain managed to your expectations on prior hospitalizations?   No prior hospitalizations  2.What is your expectation for pain management during this hospitalization?     Epidural  3.How can we help you reach that goal? unsure  Record the patient's initial score and the patient's pain goal.   Pain: 6  Pain Goal: 7 [facility] wants you to be able to say your pain was always managed very well.  Cephus ShellingBURGER,Chyane Greer 10/23/2017

## 2017-10-23 NOTE — H&P (Signed)
Anna Rhodes is a 31 y.o. female presenting for IOL for pre-gestational DM well controlled on Metformin and Chronic hypertension well controlled on labetalol.  GBS positive.  Patient received 2 doses of VMP overnight; last at 5 am.  No HA, CP/SOB, RUQ pain or visual disturbance.   OB History    Gravida Para Term Preterm AB Living   1             SAB TAB Ectopic Multiple Live Births                 Past Medical History:  Diagnosis Date  . Diabetes mellitus without complication (HCC)   . Headache(784.0) 02/28/2010   migraines  . HYPERTENSION 02/28/2010  . Pregnancy 03/05/2017   about 7 wks now  . Seizures (HCC)    last seizure 15 years ago meds stopped 11/2016 per doc request  . Vaginal Pap smear, abnormal    Past Surgical History:  Procedure Laterality Date  . LEEP    . NO PAST SURGERIES     Family History: family history includes Alcohol abuse in her brother; Arthritis in her mother; Dementia in her mother; Diabetes in her father, maternal grandmother, mother, and paternal grandmother; Hypercholesterolemia in her sister; Hypertension in her father, mother, and sister; Kidney failure in her mother; Mental illness in her brother and mother; Stroke in her father and mother. Social History:  reports that  has never smoked. she has never used smokeless tobacco. She reports that she does not drink alcohol or use drugs.     Maternal Diabetes: Yes:  Diabetes Type:  Pre-pregnancy Genetic Screening: Normal Maternal Ultrasounds/Referrals: Normal Fetal Ultrasounds or other Referrals:  None Maternal Substance Abuse:  No Significant Maternal Medications:  Meds include: Other: metformin, labetalol Significant Maternal Lab Results:  Lab values include: Group B Strep positive Other Comments:  None  ROS Maternal Medical History:  Contractions: Frequency: regular.   Perceived severity is mild.    Fetal activity: Perceived fetal activity is normal.   Last perceived fetal movement was within the  past hour.    Prenatal complications: PIH.   Prenatal Complications - Diabetes: type 2. Diabetes is managed by oral agent (monotherapy).      Dilation: 1 Effacement (%): Thick Station: Ballotable Exam by:: L.Stubbs, RN Blood pressure (!) 172/110, pulse 82, temperature 97.6 F (36.4 C), resp. rate 18, height 5' 6.5" (1.689 m), weight 218 lb (98.9 kg), last menstrual period 01/30/2017. Maternal Exam:  Uterine Assessment: Contraction strength is mild.  Contraction frequency is regular.   Abdomen: Fundal height is c/w dates.   Estimated fetal weight is 7#8.       Physical Exam  Constitutional: She is oriented to person, place, and time. She appears well-developed and well-nourished.  GI: Soft. There is no tenderness. There is no rebound and no guarding.  Neurological: She is alert and oriented to person, place, and time.  Skin: Skin is warm and dry.  Psychiatric: She has a normal mood and affect. Her behavior is normal.    Prenatal labs: ABO, Rh: --/--/O POS, O POS (11/24 0117) Antibody: NEG (11/24 0117) Rubella: Immune (04/26 0000) RPR: Nonreactive (04/26 0000)  HBsAg: Negative (04/26 0000)  HIV: Non-reactive (04/26 0000)  GBS:     Assessment/Plan: 31yo G1 at 38 w for IOL -Epidural when ready -CHTN-IV labetalol prn -DM-excellent CBG; space to q 4h -Anticipate NSVD   Necie Wilcoxson 10/23/2017, 8:54 AM

## 2017-10-23 NOTE — Progress Notes (Signed)
Pt vomiting, MD aware.

## 2017-10-23 NOTE — Progress Notes (Signed)
MD advised RN to notify if BP is consistently >165/110.

## 2017-10-23 NOTE — Anesthesia Procedure Notes (Signed)
Epidural Patient location during procedure: OB Start time: 10/23/2017 1:54 PM End time: 10/23/2017 2:10 PM  Staffing Anesthesiologist: Lowella CurbMiller, Katriona Schmierer Ray, MD Performed: anesthesiologist   Preanesthetic Checklist Completed: patient identified, site marked, surgical consent, pre-op evaluation, timeout performed, IV checked, risks and benefits discussed and monitors and equipment checked  Epidural Patient position: sitting Prep: ChloraPrep Patient monitoring: heart rate, cardiac monitor, continuous pulse ox and blood pressure Approach: midline Injection technique: LOR air  Needle:  Needle type: Tuohy  Needle gauge: 17 G Needle length: 9 cm Needle insertion depth: 7 cm Catheter type: closed end flexible Catheter size: 20 Guage Catheter at skin depth: 11 cm Test dose: negative  Assessment Events: blood not aspirated, injection not painful, no injection resistance, negative IV test and no paresthesia  Additional Notes Reason for block:procedure for pain

## 2017-10-23 NOTE — Anesthesia Preprocedure Evaluation (Signed)
Anesthesia Evaluation  Patient identified by MRN, date of birth, ID band Patient awake    Reviewed: Allergy & Precautions, NPO status , Patient's Chart, lab work & pertinent test results  Airway Mallampati: II  TM Distance: >3 FB Neck ROM: Full    Dental no notable dental hx.    Pulmonary neg pulmonary ROS,    Pulmonary exam normal breath sounds clear to auscultation       Cardiovascular hypertension, negative cardio ROS Normal cardiovascular exam Rhythm:Regular Rate:Normal     Neuro/Psych negative neurological ROS  negative psych ROS   GI/Hepatic negative GI ROS, Neg liver ROS,   Endo/Other  negative endocrine ROSdiabetes  Renal/GU negative Renal ROS  negative genitourinary   Musculoskeletal negative musculoskeletal ROS (+)   Abdominal   Peds negative pediatric ROS (+)  Hematology negative hematology ROS (+)   Anesthesia Other Findings   Reproductive/Obstetrics negative OB ROS (+) Pregnancy                             Anesthesia Physical Anesthesia Plan  ASA: II  Anesthesia Plan: Epidural   Post-op Pain Management:    Induction:   PONV Risk Score and Plan:   Airway Management Planned:   Additional Equipment:   Intra-op Plan:   Post-operative Plan:   Informed Consent:   Plan Discussed with:   Anesthesia Plan Comments:         Anesthesia Quick Evaluation

## 2017-10-23 NOTE — Lactation Note (Signed)
This note was copied from a baby's chart. Lactation Consultation Note  Patient Name: Anna Eunice BlaseDana Rosenboom ZOXWR'UToday's Date: 10/23/2017 Reason for consult: Initial assessment   Initial assessment with mom of 7 hour old infant. Infant with 2 BF attempts, bottle x 1 of 5 cc, LATCH score of 6-7. Infant STS with mom and in a deep sleep. Maternal history of CHTN and is on MgSO4. Maternal history of type 2 DM and seizure disorder.   Enc mom to feed infant STS 8-12 x in 24 hours at first feeding cues. Enc mom to keep infant STS as much as possible. Mom is on MgSO4 and is asking to have infant in the nursery until she is off Mg. Enc mom to keep infant in the room as much as possible so she can feed infant at the breast with feeding cues. Reviewed supply and demand and milk coming to volume. Reviewed importance of hand expressing or pumping every 2-3 hours if infant not BF.   Showed mom how to hand express and able to express about 3 ml combined from both breasts. Mom with large pendulous breasts with everted nipples. Left breast is larger than the right breast and left nipple is larger than the right nipple.   BF Resources Handout and LC Brochure given, mom informed of IP/OP Services, BF Support Groups and LC phone #. [facility] and Spectra 2 pump at home.   Mom reports she has no further questions/concerns at this time. She was asking to take infant to the nursery, reported to TraffordMaggie, Charity fundraiserN.    Maternal Data Has patient been taught Hand Expression?: Yes Does the patient have breastfeeding experience prior to this delivery?: No  Feeding Feeding Type: Breast Fed  LATCH Score Latch: Repeated attempts needed to sustain latch, nipple held in mouth throughout feeding, stimulation needed to elicit sucking reflex.  Audible Swallowing: A few with stimulation  Type of Nipple: Everted at rest and after stimulation  Comfort (Breast/Nipple): Soft / non-tender  Hold (Positioning): Assistance needed to  correctly position infant at breast and maintain latch.  LATCH Score: 7  Interventions Interventions: Hand express  Lactation Tools Discussed/Used WIC Program: Yes   Consult Status Consult Status: Follow-up Date: 10/24/17 Follow-up type: In-patient    [name] 10/23/2017, 10:29 PM

## 2017-10-24 LAB — COMPREHENSIVE METABOLIC PANEL
ALBUMIN: 2.3 g/dL — AB (ref 3.5–5.0)
ALK PHOS: 122 U/L (ref 38–126)
ALT: 16 U/L (ref 14–54)
AST: 25 U/L (ref 15–41)
Anion gap: 5 (ref 5–15)
BUN: 7 mg/dL (ref 6–20)
CALCIUM: 7.2 mg/dL — AB (ref 8.9–10.3)
CHLORIDE: 104 mmol/L (ref 101–111)
CO2: 23 mmol/L (ref 22–32)
CREATININE: 0.51 mg/dL (ref 0.44–1.00)
GFR calc non Af Amer: >60 mL/min (ref >60)
GLUCOSE: 158 mg/dL — AB (ref 65–99)
Potassium: 3.3 mmol/L — ABNORMAL LOW (ref 3.5–5.1)
SODIUM: 132 mmol/L — AB (ref 135–145)
Total Bilirubin: 0.2 mg/dL — ABNORMAL LOW (ref 0.3–1.2)
Total Protein: 5.5 g/dL — ABNORMAL LOW (ref 6.5–8.1)

## 2017-10-24 LAB — GLUCOSE, CAPILLARY
GLUCOSE-CAPILLARY: 153 mg/dL — AB (ref 65–99)
Glucose-Capillary: 126 mg/dL — ABNORMAL HIGH (ref 65–99)
Glucose-Capillary: 104 mg/dL — ABNORMAL HIGH (ref 65–99)

## 2017-10-24 LAB — CBC
HCT: 27.7 % — ABNORMAL LOW (ref 36.0–46.0)
HEMOGLOBIN: 9.5 g/dL — AB (ref 12.0–15.0)
MCH: 30 pg (ref 26.0–34.0)
MCHC: 34.3 g/dL (ref 30.0–36.0)
MCV: 87.4 fL (ref 78.0–100.0)
PLATELETS: 211 10*3/uL (ref 150–400)
RBC: 3.17 MIL/uL — AB (ref 3.87–5.11)
RDW: 13.2 % (ref 11.5–15.5)
WBC: 10.2 10*3/uL (ref 4.0–10.5)

## 2017-10-24 NOTE — Lactation Note (Signed)
This note was copied from a baby's chart. Lactation Consultation Note  Patient Name: Anna Rhodes ZOXWR'UToday's Date: 10/24/2017 Reason for consult: Follow-up assessment Assisted mom with football hold on right side.   Baby opened wide and latched deep. Baby needed stimulation to stay active.  Demonstrated breast massage during feeding.  Instructed to feed with cues and to call for assist/concerns prn.  Maternal Data    Feeding Feeding Type: Breast Fed Length of feed: 15 min  LATCH Score Latch: Grasps breast easily, tongue down, lips flanged, rhythmical sucking.  Audible Swallowing: A few with stimulation  Type of Nipple: Everted at rest and after stimulation  Comfort (Breast/Nipple): Soft / non-tender  Hold (Positioning): Assistance needed to correctly position infant at breast and maintain latch.  LATCH Score: 8  Interventions Interventions: Breast feeding basics reviewed;Assisted with latch;Breast compression;Adjust position;Breast massage;Support pillows;Position options  Lactation Tools Discussed/Used     Consult Status Consult Status: Follow-up Date: 10/25/17 Follow-up type: In-patient    Huston FoleyMOULDEN, Gaetan Spieker S 10/24/2017, 2:27 PM

## 2017-10-24 NOTE — Anesthesia Postprocedure Evaluation (Signed)
Anesthesia Post Note  Patient: Anna Rhodes  Procedure(s) Performed: AN AD HOC LABOR EPIDURAL     Patient location during evaluation: [facility] Anesthesia Type: Epidural Level of consciousness: awake and alert and oriented Pain management: satisfactory to patient Vital Signs Assessment: post-procedure vital signs reviewed and stable Respiratory status: respiratory function stable Cardiovascular status: stable Postop Assessment: no headache, no backache, epidural receding, patient able to bend at knees, no signs of nausea or vomiting and adequate PO intake Anesthetic complications: no    Last Vitals:  Vitals:   10/24/17 0600 10/24/17 0700  BP:    Pulse:    Resp: 18 17  Temp:    SpO2:      Last Pain:  Vitals:   10/24/17 0600  TempSrc:   PainSc: 2    Pain Goal: Patients Stated Pain Goal: 0 (10/24/17 0600)               Ashantae Pangallo

## 2017-10-24 NOTE — Progress Notes (Signed)
Post Partum Day 1 Subjective: no complaints, up ad lib and tolerating PO. Foley in for magnesium.  No HA, CP/SOB, RUQ pain, or visual disturbance.  Objective: Blood pressure 121/82, pulse 96, temperature 98.2 F (36.8 C), temperature source Oral, resp. rate 18, height 5' 6.5" (1.689 m), weight 218 lb (98.9 kg), last menstrual period 01/30/2017, SpO2 95 %.  Physical Exam:  General: alert, cooperative and appears stated age Lochia: appropriate Uterine Fundus: firm Incision: healing well, no significant drainage, no dehiscence DVT Evaluation: No evidence of DVT seen on physical exam. Negative Homan's sign. No cords or calf tenderness.  Recent Labs    10/23/17 1558 10/24/17 0548  HGB 12.2 9.5*  HCT 36.6 27.7*    Assessment/Plan: Plan for discharge tomorrow  Exacerbation of CHTN-on magnesium recovery.  Labs wnl and no s/sx pre-eclampsia.   DM-All cbg<200.  Hold metformin restart until taking more po.   LOS: 1 day   Anna Rhodes 10/24/2017, 9:10 AM

## 2017-10-25 ENCOUNTER — Encounter (HOSPITAL_COMMUNITY): Payer: Self-pay

## 2017-10-25 LAB — GLUCOSE, CAPILLARY: Glucose-Capillary: 88 mg/dL (ref 65–99)

## 2017-10-25 MED ORDER — ACETAMINOPHEN 325 MG PO TABS: 650.0000 mg | ORAL_TABLET | ORAL | 0 refills | Status: AC | PRN

## 2017-10-25 MED ORDER — IBUPROFEN 600 MG PO TABS: 600.0000 mg | ORAL_TABLET | Freq: Four times a day (QID) | ORAL | 0 refills | Status: AC | PRN

## 2017-10-25 MED ORDER — METFORMIN HCL ER 500 MG PO TB24: 1000.0000 mg | ORAL_TABLET | Freq: Every day | ORAL | 1 refills | Status: DC

## 2017-10-25 MED ORDER — LABETALOL HCL 200 MG PO TABS: 200.0000 mg | ORAL_TABLET | Freq: Two times a day (BID) | ORAL | 1 refills | Status: AC

## 2017-10-25 MED ORDER — METFORMIN HCL ER 500 MG PO TB24: 1000.0000 mg | ORAL_TABLET | Freq: Every day | ORAL | 1 refills | Status: AC

## 2017-10-25 MED ORDER — LABETALOL HCL 200 MG PO TABS: 200.0000 mg | ORAL_TABLET | Freq: Two times a day (BID) | ORAL | 1 refills | Status: DC

## 2017-10-25 NOTE — Discharge Summary (Signed)
Obstetric Discharge Summary Reason for Admission: induction of labor Prenatal Procedures: none Intrapartum Procedures: spontaneous vaginal delivery Postpartum Procedures: none Complications-Operative and Postpartum: none Hemoglobin  Date Value Ref Range Status  10/24/2017 9.5 (L) 12.0 - 15.0 g/dL Final    Comment:    REPEATED TO VERIFY DELTA CHECK NOTED    HCT  Date Value Ref Range Status  10/24/2017 27.7 (L) 36.0 - 46.0 % Final    Physical Exam:  General: alert, cooperative and no distress Lochia: appropriate Uterine Fundus: firm Incision: healing well DVT Evaluation: No evidence of DVT seen on physical exam.  Discharge Diagnoses: Term Pregnancy-delivered  Discharge Information: Date: 10/25/2017 Activity: pelvic rest Diet: routine Medications: PNV and Ibuprofen Condition: stable Instructions: refer to practice specific booklet Discharge to: home   Newborn Data: Live born female  Birth Weight: 6 lb 14.4 oz (3130 g) APGAR: 8, 9  Newborn Delivery   Birth date/time:  10/23/2017 15:15:00 Delivery type:  Vaginal, Spontaneous     Home with mother.  Roselle LocusJames E Jovi Zavadil II 10/25/2017, 8:01 AM

## 2017-10-25 NOTE — Progress Notes (Signed)
Discharged home in stable condition, ambulatory, with infant.

## 2017-10-25 NOTE — Lactation Note (Signed)
This note was copied from a baby's chart. Lactation Consultation Note  Patient Name: Anna Eunice BlaseDana Alicea ZOXWR'UToday's Date: 10/25/2017 Reason for consult: Follow-up assessment;Early term 37-38.6wks;Infant weight loss(3% weight loss )  Baby is 2544 hours old - 1st visit  mom in the shower - per family member  2nd visit - mom available and requesting to see WIC to signed the baby up.  Per mom has  A DEBP Spectra 2 at home.  LC discussed the difference from the Medela DEBP compared to Spectra 2 - #24 F and #28.  Supply / demand and the importance of consistent stimulation around the clock to establish and protect  Milk supply. [facility] to latch and the score was 8. Per mom has latched the baby a few times.  And the baby acting like she wasn't getting enough so she asked for bottles. 11-7 all bottles.  LC explored options with mom to transition back to the breast,  If the baby is calm and not to hungry latch the baby after breast massage , hand express.  After feeding 1st breast offer the 2nd breast, and if supplementing after breast feeding keep it low.  If baby is really hungry, may have to give the baby an appetizer 5-10 ml so the baby goes to the breast calmer.  If the baby won't latch - PACE feed the baby and pump 15 -20 mins with Spectra 2.  LC recommended since the baby has had several bottles, and mom is post MagS04 - to counteract / add post pumping  For 10- 15 mins until the milk comes in/ save milk and feed back to baby instead of formula.  Mom denies sore nipples, breast are heavier per mom.  Sore nipple and engorgement prevention and tx reviewed.  LC [facility] This Thursday or Friday.  Mom receptive for a F/U LC O/P feeding assessment and is aware the clinic will call her.  Mom also aware WIC rep is aware she would like to be seen.     Maternal Data    Feeding Feeding Type: Bottle Fed -  Formula  LATCH Score                   Interventions Interventions: Breast feeding basics reviewed  Lactation Tools Discussed/Used     Consult Status Consult Status: Follow-up Date: (LC placed a request in the [facility] clinic [facility] Thursday or Friday of this week ) Follow-up type: Out-patient    Anna Rhodes 10/25/2017, 11:31 AM

## 2017-10-25 NOTE — Progress Notes (Signed)
Discharge instructions given to patient and she verbalized understanding of all instructions given. Written copy of AVS given to patient. 

## 2017-11-25 ENCOUNTER — Encounter (HOSPITAL_COMMUNITY): Payer: Self-pay

## 2017-11-25 ENCOUNTER — Inpatient Hospital Stay (HOSPITAL_COMMUNITY)
Admission: AD | Admit: 2017-11-25 | Discharge: 2017-11-25 | Disposition: A | Discharge status: Home / Self Care | Payer: 59 | Source: Ambulatory Visit | Attending: Obstetrics and Gynecology | Admitting: Obstetrics and Gynecology

## 2017-11-25 ENCOUNTER — Other Ambulatory Visit: Payer: Self-pay

## 2017-11-25 DIAGNOSIS — O135 Gestational [pregnancy-induced] hypertension without significant proteinuria, complicating the puerperium: Secondary | ICD-10-CM | POA: Diagnosis not present

## 2017-11-25 LAB — COMPREHENSIVE METABOLIC PANEL
ALBUMIN: 4 g/dL (ref 3.5–5.0)
ALT: 20 U/L (ref 14–54)
AST: 21 U/L (ref 15–41)
Alkaline Phosphatase: 83 U/L (ref 38–126)
Anion gap: 12 (ref 5–15)
BUN: 10 mg/dL (ref 6–20)
CHLORIDE: 102 mmol/L (ref 101–111)
CO2: 27 mmol/L (ref 22–32)
CREATININE: 0.74 mg/dL (ref 0.44–1.00)
Calcium: 9.4 mg/dL (ref 8.9–10.3)
GFR calc non Af Amer: >60 mL/min (ref >60)
Glucose, Bld: 98 mg/dL (ref 65–99)
Potassium: 3.8 mmol/L (ref 3.5–5.1)
SODIUM: 141 mmol/L (ref 135–145)
Total Bilirubin: 0.4 mg/dL (ref 0.3–1.2)
Total Protein: 7.5 g/dL (ref 6.5–8.1)

## 2017-11-25 LAB — CBC
HCT: 40.4 % (ref 36.0–46.0)
Hemoglobin: 13.2 g/dL (ref 12.0–15.0)
MCH: 28.1 pg (ref 26.0–34.0)
MCHC: 32.7 g/dL (ref 30.0–36.0)
MCV: 86.1 fL (ref 78.0–100.0)
PLATELETS: 296 10*3/uL (ref 150–400)
RBC: 4.69 MIL/uL (ref 3.87–5.11)
RDW: 13.9 % (ref 11.5–15.5)
WBC: 8.5 10*3/uL (ref 4.0–10.5)

## 2017-11-25 LAB — PROTEIN / CREATININE RATIO, URINE
Creatinine, Urine: 436 mg/dL
Protein Creatinine Ratio: 0.04 mg/mg{Cre} (ref 0.00–0.15)
TOTAL PROTEIN, URINE: 17 mg/dL

## 2017-11-25 MED ORDER — LABETALOL HCL 200 MG PO TABS: 400.0000 mg | ORAL_TABLET | Freq: Three times a day (TID) | ORAL | 1 refills | Status: DC

## 2017-11-25 MED ORDER — LABETALOL HCL 200 MG PO TABS: 400.0000 mg | ORAL_TABLET | Freq: Once | ORAL | 0 refills | Status: DC

## 2017-11-25 MED ORDER — OXYCODONE-ACETAMINOPHEN 5-325 MG PO TABS
1.0000 | ORAL_TABLET | Freq: Once | ORAL | Status: AC
  Administered 2017-11-25: 1 via ORAL
  Filled 2017-11-25: qty 1

## 2017-11-25 MED ORDER — HYDRALAZINE HCL 20 MG/ML IJ SOLN: 5.0000 mg | INTRAMUSCULAR | Status: DC | PRN

## 2017-11-25 MED ORDER — LABETALOL HCL 5 MG/ML IV SOLN: 20.0000 mg | INTRAVENOUS | Status: DC | PRN

## 2017-11-25 MED ORDER — NIFEDIPINE ER OSMOTIC RELEASE 30 MG PO TB24: 30.0000 mg | ORAL_TABLET | Freq: Every day | ORAL | 11 refills | Status: AC

## 2017-11-25 MED ORDER — LABETALOL HCL 200 MG PO TABS
400.0000 mg | ORAL_TABLET | Freq: Once | ORAL | Status: AC
  Administered 2017-11-25: 400 mg via ORAL
  Filled 2017-11-25: qty 4
  Filled 2017-11-25: qty 2

## 2017-11-25 NOTE — MAU Note (Signed)
Pt sent form office for bp. She states It was 162/110.

## 2017-11-25 NOTE — MAU Provider Note (Signed)
History     CSN: [id number]  Arrival date and time: 11/25/17 1442   First Provider Initiated Contact with Patient 11/25/17 1537      Chief Complaint  Patient presents with  . Hypertension   HPI  HPI: Anna Rhodes is a 31 y.o. year old [id number] female 4 weeks postpartum who presents to MAU from the [facility] office for elevated BP (162/110). She has a H/A on the LT frontal part of her head. She reports having "some elevated BPs near the end of pregnancy". She is on Labetalol 200 mg TID now. She reports her "BP has been increasing and getting worse since she was d/c'd home.  It all caught up with me last night. My H/A got worse and I started vomiting." She denies any bleeding.  Past Medical History:  Diagnosis Date  . Diabetes mellitus without complication (HCC)   . Headache(784.0) 02/28/2010   migraines  . HYPERTENSION 02/28/2010  . Pregnancy 03/05/2017   about 7 wks now  . Seizures (HCC)    last seizure 15 years ago meds stopped 11/2016 per doc request  . Vaginal Pap smear, abnormal     Past Surgical History:  Procedure Laterality Date  . LEEP    . NO PAST SURGERIES      Family History  Problem Relation Age of Onset  . Stroke Mother   . Hypertension Mother   . Mental illness Mother        schizophrenia  . Diabetes Mother   . Kidney failure Mother   . Arthritis Mother   . Dementia Mother   . Hypertension Father   . Diabetes Father   . Stroke Father   . Hypertension Sister   . Hypercholesterolemia Sister   . Alcohol abuse Brother   . Mental illness Brother        schizophrenia  . Diabetes Maternal Grandmother   . Diabetes Paternal Grandmother     Social History   Tobacco Use  . Smoking status: Never Smoker  . Smokeless tobacco: Never Used  Substance Use Topics  . Alcohol use: No  . Drug use: No    Allergies:  Allergies  Allergen Reactions  . Bactrim Hives    Medications Prior to Admission  Medication Sig Dispense Refill Last Dose  . ibuprofen  (ADVIL,MOTRIN) 600 MG tablet Take 1 tablet (600 mg total) by mouth every 6 (six) hours as needed. 30 tablet 0 11/24/2017 at Unknown time  . labetalol (NORMODYNE) 200 MG tablet Take 1 tablet (200 mg total) by mouth 2 (two) times daily. (Patient taking differently: Take 200 mg by mouth 3 (three) times daily. ) 60 tablet 1 11/25/2017 at 1100  . metFORMIN (GLUCOPHAGE XR) 500 MG 24 hr tablet Take 2 tablets (1,000 mg total) by mouth daily with breakfast. 60 tablet 1 11/25/2017 at Unknown time  . Prenatal Vit-Fe Fumarate-FA (PRENATAL MULTIVITAMIN) TABS tablet Take 1 tablet by mouth daily at 12 noon.   11/25/2017 at Unknown time  . acetaminophen (TYLENOL) 325 MG tablet Take 2 tablets (650 mg total) by mouth every 4 (four) hours as needed (for pain scale < 4). 60 tablet 0 PRN    Review of Systems  Constitutional: Negative.   HENT: Negative.   Eyes: Negative.   Respiratory: Negative.   Cardiovascular: Negative.   Gastrointestinal: Negative.   Endocrine: Negative.   Genitourinary: Negative.   Musculoskeletal: Negative.   Skin: Negative.   Allergic/Immunologic: Negative.   Neurological: Positive for headaches.  Hematological: Negative.  Psychiatric/Behavioral: Negative.    Physical Exam   Patient Vitals for the past 24 hrs:  BP Temp Pulse Resp SpO2 Height Weight  11/25/17 2005 (!) 157/103 - 73 - - - -  11/25/17 1931 (!) 157/103 - 73 - - - -  11/25/17 1901 (!) 165/111 - 78 - 98 % - -  11/25/17 1846 (!) 164/104 - 75 - - - -  11/25/17 1831 (!) 164/100 - 73 - - - -  11/25/17 1816 (!) 159/113 - 81 - 95 % - -  11/25/17 1747 (!) 158/106 - 79 - 100 % - -  11/25/17 1746 (!) 161/103 - 77 - 100 % - -  11/25/17 1716 (!) 149/99 - 79 - - - -  11/25/17 1701 (!) 154/102 - 78 - 100 % - -  11/25/17 1646 (!) 155/94 - 79 - - - -  11/25/17 1631 (!) 153/98 - 78 - - - -  11/25/17 1616 (!) 155/92 - 79 - - - -  11/25/17 1601 (!) 148/99 - 80 - 100 % - -  11/25/17 1546 136/83 - 76 - - - -  11/25/17 1531 (!)  151/98 - 81 - - - -  11/25/17 1525 (!) 152/100 - 81 - - - -  11/25/17 1516 - 97.9 F (36.6 C) - - 99 % - -  11/25/17 1509 (!) 151/107 (!) 97.5 F (36.4 C) 87 18 99 % 5\' 6"  (1.676 m) 192 lb 8 oz (87.3 kg)    Physical Exam  Nursing note and vitals reviewed. Constitutional: She is oriented to person, place, and time. She appears well-developed and well-nourished.  HENT:  Head: Normocephalic.  Eyes: Pupils are equal, round, and reactive to light.  Neck: Normal range of motion.  Cardiovascular: Normal rate, regular rhythm and normal heart sounds.  Respiratory: Effort normal and breath sounds normal.  GI: Soft. Bowel sounds are normal.  Genitourinary:  Genitourinary Comments: Pelvic deferred  Musculoskeletal: Normal range of motion.  Neurological: She is alert and oriented to person, place, and time.  Skin: Skin is warm and dry.  Psychiatric: She has a normal mood and affect. Her behavior is normal. Judgment and thought content normal.    MAU Course  Procedures  MDM CCUA CBC CMP P/C Ratio Serial BPs Labetalol 400 mg now Hydralazine Protocol *Consult with Dr. Elon SpannerLeger @ 1740 - notified of patient's complaints, assessments, lab results & serial BPs *Dr. Elon SpannerLeger updated on increasing BPs @ 1930 - recommended tx plan @keep  Labetalol to 200 mg TID, add Nifedipine XL 30 mg daily, BP recheck Monday   Results for orders placed or performed during the hospital encounter of 11/25/17 (from the past 24 hour(s))  Protein / creatinine ratio, urine     Status: None   Collection Time: 11/25/17  3:05 PM  Result Value Ref Range   Creatinine, Urine 436.00 mg/dL   Total Protein, Urine 17 mg/dL   Protein Creatinine Ratio 0.04 0.00 - 0.15 mg/mg[Cre]  CBC     Status: None   Collection Time: 11/25/17  3:18 PM  Result Value Ref Range   WBC 8.5 4.0 - 10.5 K/uL   RBC 4.69 3.87 - 5.11 MIL/uL   Hemoglobin 13.2 12.0 - 15.0 g/dL   HCT 16.140.4 09.636.0 - 04.546.0 %   MCV 86.1 78.0 - 100.0 fL   MCH 28.1 26.0 - 34.0  pg   MCHC 32.7 30.0 - 36.0 g/dL   RDW 40.913.9 81.111.5 - 91.415.5 %   Platelets 296 150 -  400 K/uL  Comprehensive metabolic panel     Status: None   Collection Time: 11/25/17  3:18 PM  Result Value Ref Range   Sodium 141 135 - 145 mmol/L   Potassium 3.8 3.5 - 5.1 mmol/L   Chloride 102 101 - 111 mmol/L   CO2 27 22 - 32 mmol/L   Glucose, Bld 98 65 - 99 mg/dL   BUN 10 6 - 20 mg/dL   Creatinine, Ser 1.91 0.44 - 1.00 mg/dL   Calcium 9.4 8.9 - 47.8 mg/dL   Total Protein 7.5 6.5 - 8.1 g/dL   Albumin 4.0 3.5 - 5.0 g/dL   AST 21 15 - 41 U/L   ALT 20 14 - 54 U/L   Alkaline Phosphatase 83 38 - 126 U/L   Total Bilirubin 0.4 0.3 - 1.2 mg/dL   GFR calc non Af Amer >60 >60 mL/min   GFR calc Af Amer >60 >60 mL/min   Anion gap 12 5 - 15    Assessment and Plan  Gestational hypertension without significant proteinuria, postpartum - Continue Labetalol 200 mg TID  - Rx for Nifedipine XL 30 mg daily - BP recheck in office 11/29/2017 -- pt to schedule - Information provided on postpartum hypertension - Discharge home   Raelyn Mora, MSN, CNM 11/25/2017, 3:45 PM

## 2017-12-13 ENCOUNTER — Other Ambulatory Visit (HOSPITAL_COMMUNITY): Payer: Self-pay | Admitting: Obstetrics and Gynecology

## 2017-12-13 DIAGNOSIS — I159 Secondary hypertension, unspecified: Secondary | ICD-10-CM

## 2017-12-16 ENCOUNTER — Ambulatory Visit (HOSPITAL_COMMUNITY)
Admission: RE | Admit: 2017-12-16 | Discharge: 2017-12-16 | Disposition: A | Discharge status: Home / Self Care | Payer: 59 | Source: Ambulatory Visit | Attending: Family Medicine | Admitting: Family Medicine

## 2017-12-16 DIAGNOSIS — O165 Unspecified maternal hypertension, complicating the puerperium: Secondary | ICD-10-CM | POA: Insufficient documentation

## 2017-12-16 DIAGNOSIS — I159 Secondary hypertension, unspecified: Secondary | ICD-10-CM

## 2018-03-17 IMAGING — US US MFM OB TRANSVAGINAL
1 series · 15 of 19 positions shown · non-contrast
Comparison: none

[Series 1: us mfm ob transvaginal · 19 acquisitions, 15 frames shown]
[im 1/19]
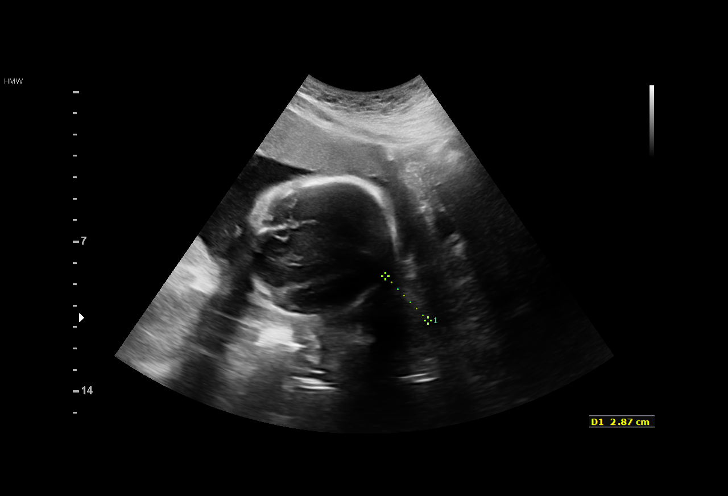
[im 2/19]
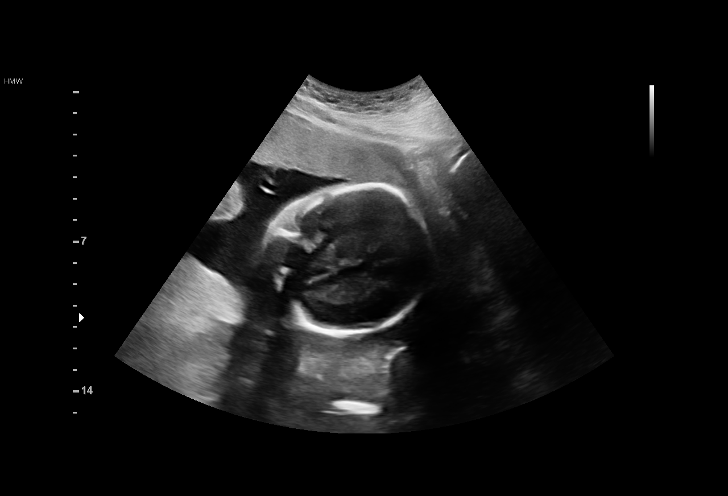
[im 4/19]
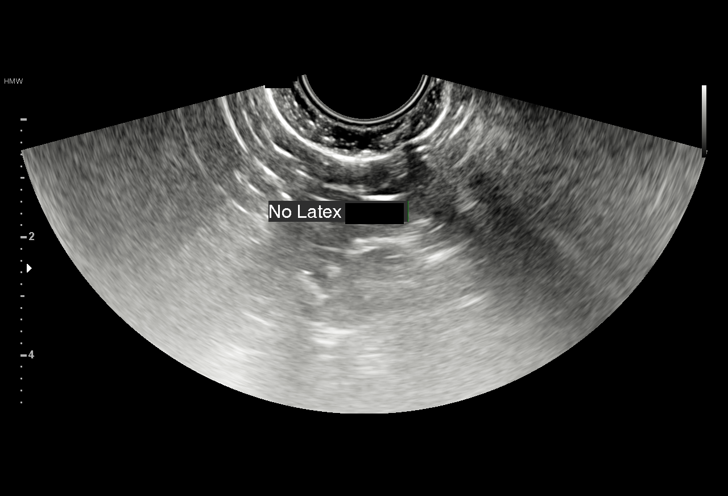
[im 5/19]
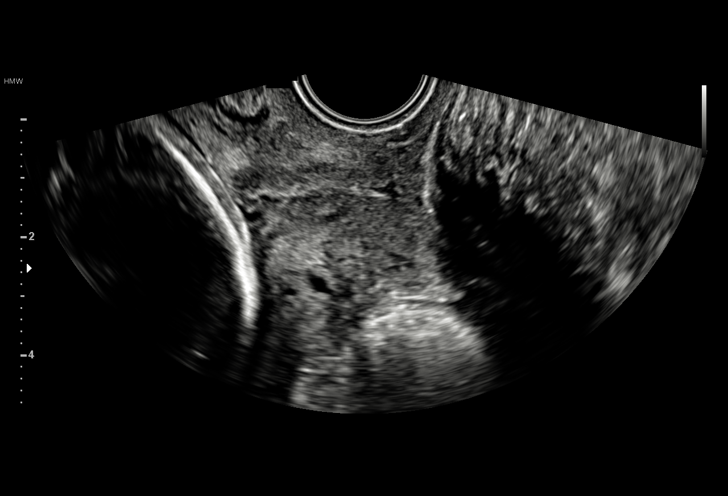
[im 6/19]
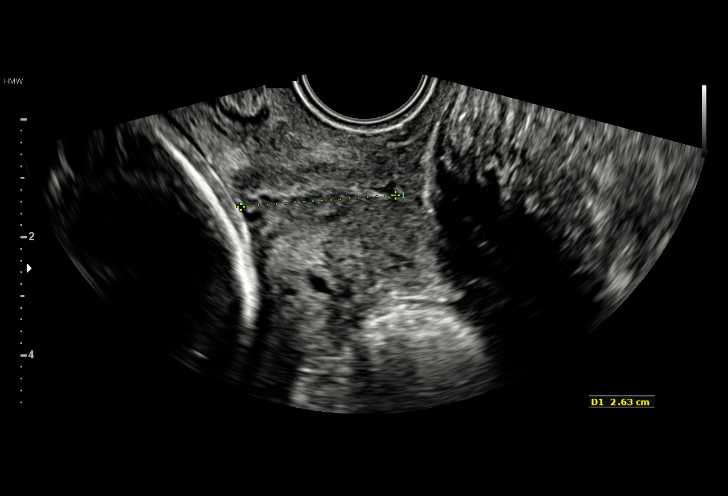
[im 7/19]
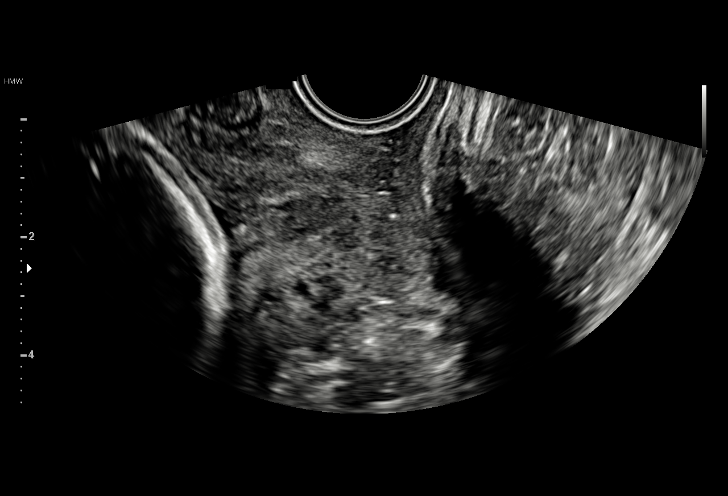
[im 9/19]
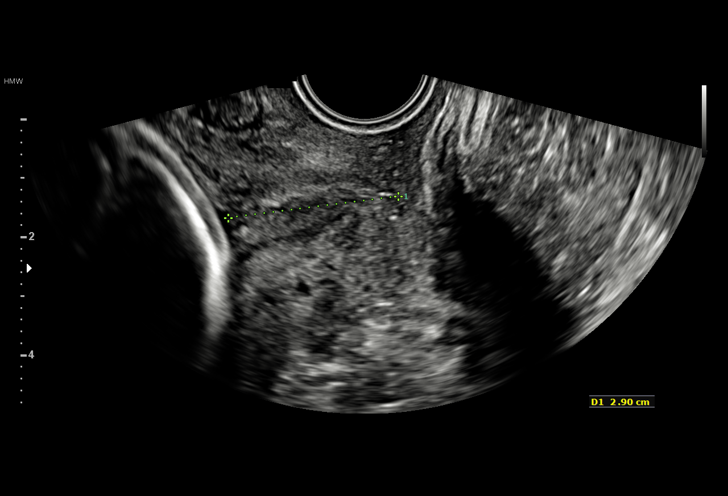
[im 10/19]
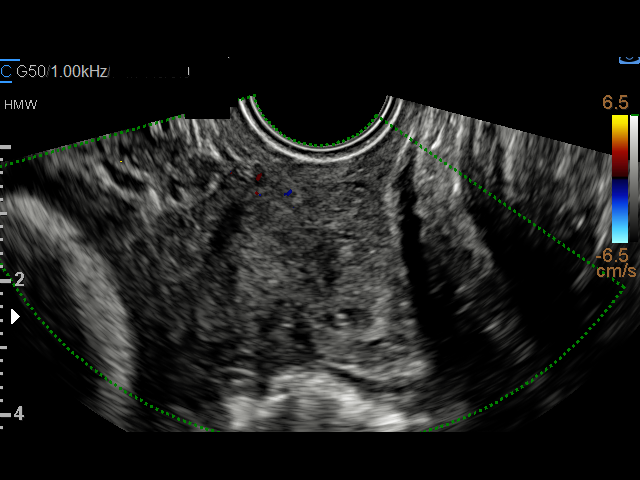
[im 11/19]
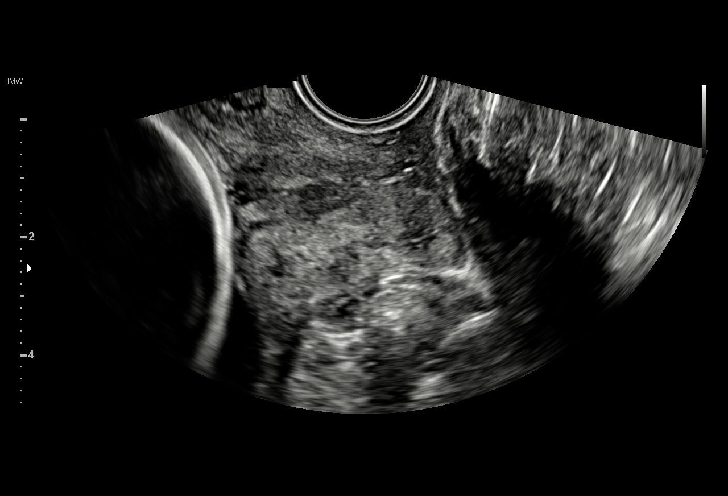
[im 13/19]
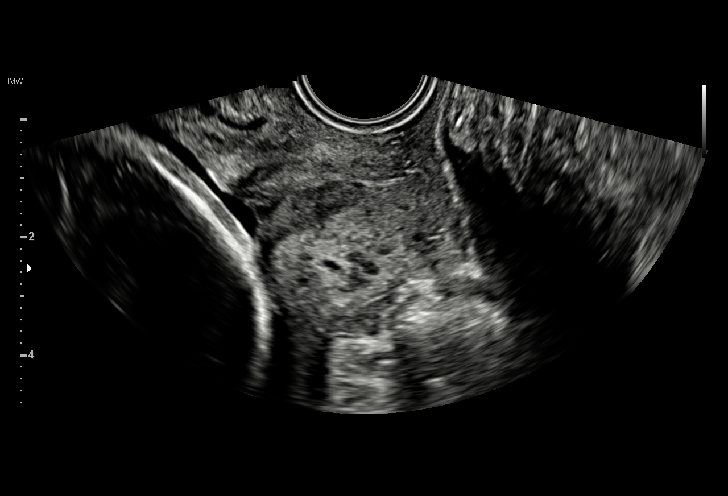
[im 14/19]
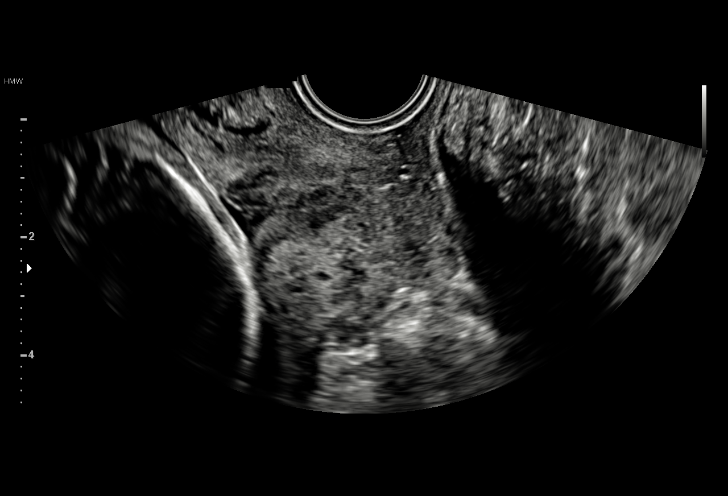
[im 15/19]
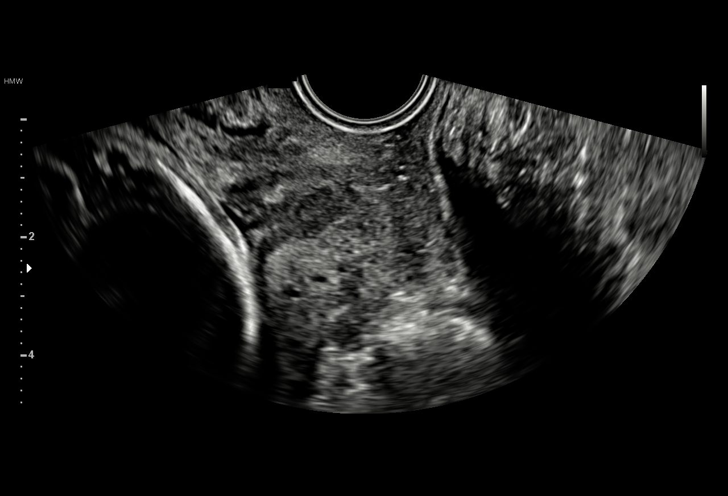
[im 16/19]
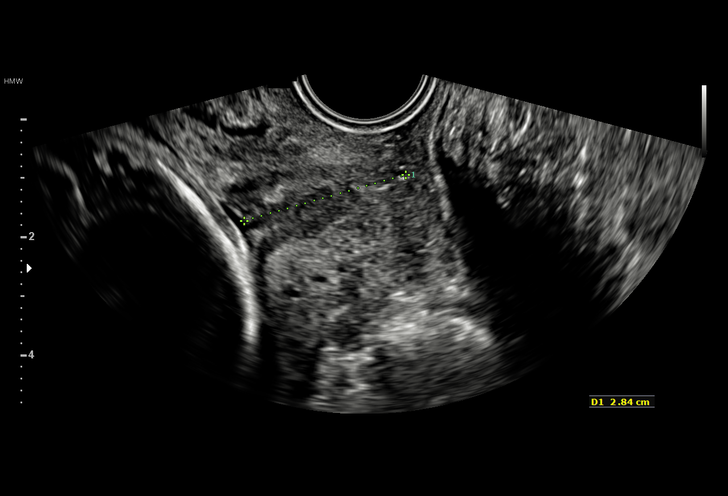
[im 18/19]
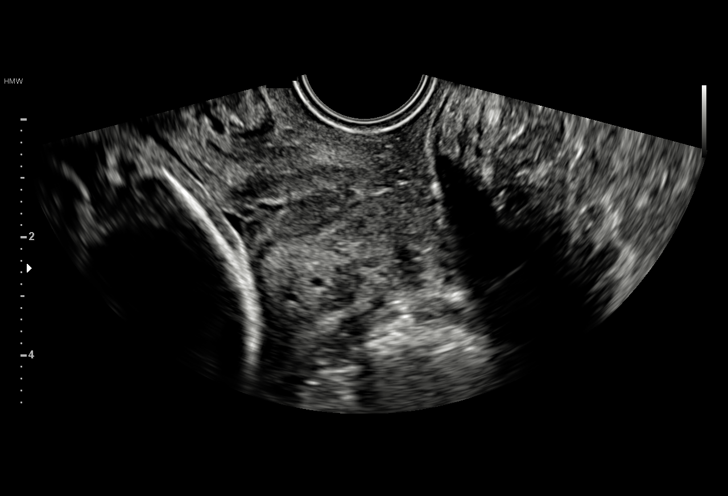
[im 19/19]
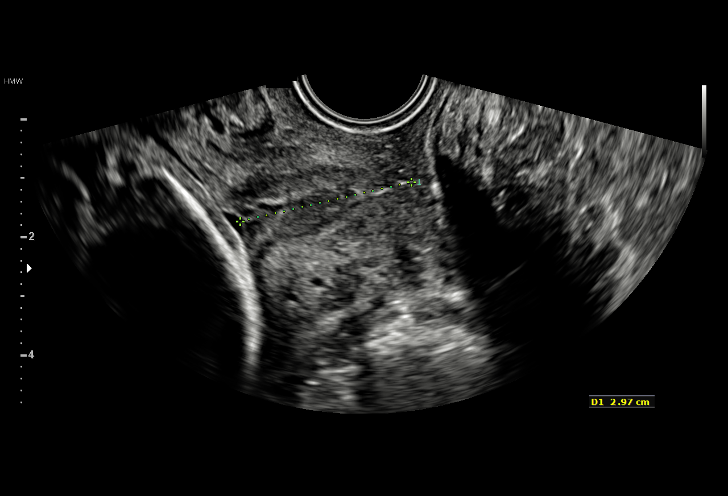

[15 of 19 positions shown; findings below may reference images not displayed]

#[id number]
Attending:        Fallon Jim         Secondary Phy.:   [facility]-
MAU/Triage

1  JOHANN SEBASTIAN LUJAN           [id number]      [id number]     [id number]
Indications

25 weeks gestation of pregnancy
Abdominal pain in pregnancy
Encounter for cervical length
OB History

Gravidity:    1         Term:   0        Prem:   0        SAB:   0
TOP:          0       Ectopic:  0        Living: 0
Fetal Evaluation

Num Of Fetuses:     1
Fetal Heart         153
Rate(bpm):
Cardiac Activity:   Observed
Presentation:       Cephalic

Amniotic Fluid
AFI FV:      Subjectively within normal limits
Gestational Age

LMP:           25w 6d        Date:  01/30/17                 EDD:   11/06/17
Best:          25w 6d     Det. By:  LMP  (01/30/17)          EDD:   11/06/17
Cervix Uterus Adnexa
Cervix
Length:            2.8  cm.
Normal appearance by transvaginal scan
Impression

Single intrauterine pregnancy at 68w7d.
Cephalic presentation.
Normal amniotic fluid volume.
Transvaginally the cervix measures 2.8cm without funneling.
Recommendations

Further ultrasounds for cervical length are not indicated at
this gestational age.
Follow up ultrasounds as clinically indicated.
